# Patient Record
Sex: Male | Born: 1955 | Race: White | Hispanic: No | Smoking: Former smoker
Health system: Southern US, Community
[De-identification: ages and names within clinical notes are randomized; demographics above are authoritative.]

## PROBLEM LIST (undated history)

## (undated) DIAGNOSIS — I639 Cerebral infarction, unspecified: Secondary | ICD-10-CM

## (undated) DIAGNOSIS — S43006A Unspecified dislocation of unspecified shoulder joint, initial encounter: Secondary | ICD-10-CM

## (undated) DIAGNOSIS — I10 Essential (primary) hypertension: Secondary | ICD-10-CM

## (undated) HISTORY — DX: Cerebral infarction, unspecified: I63.9

## (undated) HISTORY — DX: Essential (primary) hypertension: I10

## (undated) HISTORY — PX: OTHER SURGICAL HISTORY: SHX169

---

## 2003-10-04 ENCOUNTER — Emergency Department (HOSPITAL_COMMUNITY): Admission: EM | Admit: 2003-10-04 | Discharge: 2003-10-04 | Payer: Self-pay

## 2003-12-13 ENCOUNTER — Emergency Department (HOSPITAL_COMMUNITY): Admission: EM | Admit: 2003-12-13 | Discharge: 2003-12-13 | Payer: Self-pay | Admitting: Emergency Medicine

## 2004-09-21 ENCOUNTER — Emergency Department (HOSPITAL_COMMUNITY): Admission: EM | Admit: 2004-09-21 | Discharge: 2004-09-21 | Payer: Self-pay | Admitting: Emergency Medicine

## 2013-01-22 ENCOUNTER — Ambulatory Visit: Payer: Self-pay | Admitting: Orthopedic Surgery

## 2013-01-22 ENCOUNTER — Encounter (HOSPITAL_COMMUNITY): Payer: Self-pay | Admitting: Emergency Medicine

## 2013-01-22 ENCOUNTER — Emergency Department (HOSPITAL_COMMUNITY): Payer: Self-pay

## 2013-01-22 ENCOUNTER — Emergency Department (HOSPITAL_COMMUNITY)
Admission: EM | Admit: 2013-01-22 | Discharge: 2013-01-22 | Disposition: A | Discharge status: Home / Self Care | Payer: Self-pay | Attending: Emergency Medicine | Admitting: Emergency Medicine

## 2013-01-22 DIAGNOSIS — S99929A Unspecified injury of unspecified foot, initial encounter: Secondary | ICD-10-CM

## 2013-01-22 DIAGNOSIS — M2180 Other specified acquired deformities of unspecified limb: Secondary | ICD-10-CM | POA: Insufficient documentation

## 2013-01-22 DIAGNOSIS — F17200 Nicotine dependence, unspecified, uncomplicated: Secondary | ICD-10-CM | POA: Insufficient documentation

## 2013-01-22 DIAGNOSIS — S99919A Unspecified injury of unspecified ankle, initial encounter: Secondary | ICD-10-CM

## 2013-01-22 DIAGNOSIS — M79672 Pain in left foot: Secondary | ICD-10-CM

## 2013-01-22 DIAGNOSIS — S8990XA Unspecified injury of unspecified lower leg, initial encounter: Secondary | ICD-10-CM | POA: Insufficient documentation

## 2013-01-22 DIAGNOSIS — Z880 Allergy status to penicillin: Secondary | ICD-10-CM | POA: Insufficient documentation

## 2013-01-22 DIAGNOSIS — S43005A Unspecified dislocation of left shoulder joint, initial encounter: Secondary | ICD-10-CM

## 2013-01-22 DIAGNOSIS — S43016A Anterior dislocation of unspecified humerus, initial encounter: Secondary | ICD-10-CM | POA: Insufficient documentation

## 2013-01-22 DIAGNOSIS — M21829 Other specified acquired deformities of unspecified upper arm: Secondary | ICD-10-CM

## 2013-01-22 LAB — BASIC METABOLIC PANEL
BUN: 14 mg/dL (ref 6–23)
CALCIUM: 9.2 mg/dL (ref 8.4–10.5)
CHLORIDE: 100 meq/L (ref 96–112)
CO2: 23 meq/L (ref 19–32)
CREATININE: 0.86 mg/dL (ref 0.50–1.35)
GFR calc Af Amer: >90 mL/min (ref >90)
GFR calc non Af Amer: >90 mL/min (ref >90)
GLUCOSE: 160 mg/dL — AB (ref 70–99)
Potassium: 4.2 mEq/L (ref 3.7–5.3)
Sodium: 136 mEq/L — ABNORMAL LOW (ref 137–147)

## 2013-01-22 LAB — CBC
HCT: 48.3 % (ref 39.0–52.0)
HEMOGLOBIN: 16.9 g/dL (ref 13.0–17.0)
MCH: 32.6 pg (ref 26.0–34.0)
MCHC: 35 g/dL (ref 30.0–36.0)
MCV: 93.2 fL (ref 78.0–100.0)
PLATELETS: 229 10*3/uL (ref 150–400)
RBC: 5.18 MIL/uL (ref 4.22–5.81)
RDW: 12.1 % (ref 11.5–15.5)
WBC: 13.6 10*3/uL — AB (ref 4.0–10.5)

## 2013-01-22 MED ORDER — PROPOFOL 10 MG/ML IV BOLUS
1.0000 mg/kg | Freq: Once | INTRAVENOUS | Status: AC
  Administered 2013-01-22: 40 mg via INTRAVENOUS
  Administered 2013-01-22: 50 mg via INTRAVENOUS
  Administered 2013-01-22: 60 mg via INTRAVENOUS
  Filled 2013-01-22: qty 1

## 2013-01-22 MED ORDER — HYDROCODONE-ACETAMINOPHEN 5-325 MG PO TABS: 2.0000 | ORAL_TABLET | ORAL | Status: DC | PRN

## 2013-01-22 MED ORDER — HYDROCODONE-ACETAMINOPHEN 5-325 MG PO TABS: 6.0000 | ORAL_TABLET | ORAL | Status: DC | PRN

## 2013-01-22 MED ORDER — HYDROMORPHONE HCL PF 1 MG/ML IJ SOLN
1.0000 mg | Freq: Once | INTRAMUSCULAR | Status: AC
  Administered 2013-01-22: 1 mg via INTRAVENOUS
  Filled 2013-01-22: qty 1
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
## 2013-01-22 NOTE — Discharge Instructions (Signed)
If you were given medicines take as directed.  If you are on coumadin or contraceptives realize their levels and effectiveness is altered by many different medicines.  If you have any reaction (rash, tongues swelling, other) to the medicines stop taking and see a physician.   Please follow up as directed and return to the ER or see a physician for new or worsening symptoms.  Thank you. See ortho in the next two days for further treatment. Wear sling until cleared.  For severe pain take norco or vicodin however realize they have the potential for addiction and it can make you sleepy and has tylenol in it.  No operating machinery while taking.

## 2013-01-22 NOTE — ED Notes (Signed)
Pt alert & oriented x4, stable gait. Patient  given discharge instructions, paperwork & prescription(s). Patient verbalized understanding. Pt left department w/ no further questions. 
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
## 2013-01-23 ENCOUNTER — Telehealth: Payer: Self-pay | Admitting: Orthopedic Surgery

## 2013-01-23 NOTE — Telephone Encounter (Signed)
Patient, and sister, Greg Fry, called to request appointment following [facility] visit, evaluation of shoulder injury, dislocation.  Patient was seen there last evening, had reduction, and was released early this morning; states was advised to see Greg Fry, in 1 day or as soon as possible.  Please review and advise.  Patient cell# is [phone].

## 2013-01-24 ENCOUNTER — Other Ambulatory Visit: Payer: Self-pay | Admitting: *Deleted

## 2013-01-24 DIAGNOSIS — S43005A Unspecified dislocation of left shoulder joint, initial encounter: Secondary | ICD-10-CM

## 2013-01-24 NOTE — Telephone Encounter (Signed)
Put in order for CT per Dr. [name]

## 2013-01-24 NOTE — Telephone Encounter (Signed)
Contacted patient regarding status, scheduling appointment - Forwarding note to Tammy for CT order per Dr's reply.

## 2013-01-24 NOTE — Telephone Encounter (Signed)
He needs a repeat ct scan shoulder before office visit   tammy can order

## 2013-01-28 NOTE — Telephone Encounter (Signed)
Patient called back and received information regarding appointments scheduled.

## 2013-01-28 NOTE — Telephone Encounter (Signed)
Called back to patient (both Tammy,LPN) and I have left a message.  I also left voice message with patient's sister, who is his contact person, ph# [phone], to call our office back (CT scan + follow up appointment scheduled)

## 2013-01-29 ENCOUNTER — Ambulatory Visit (HOSPITAL_COMMUNITY)
Admission: RE | Admit: 2013-01-29 | Discharge: 2013-01-29 | Disposition: A | Discharge status: Home / Self Care | Payer: Self-pay | Source: Ambulatory Visit | Attending: Orthopedic Surgery | Admitting: Orthopedic Surgery

## 2013-01-29 DIAGNOSIS — S43005A Unspecified dislocation of left shoulder joint, initial encounter: Secondary | ICD-10-CM

## 2013-01-29 DIAGNOSIS — R937 Abnormal findings on diagnostic imaging of other parts of musculoskeletal system: Secondary | ICD-10-CM | POA: Insufficient documentation

## 2013-01-29 DIAGNOSIS — M25519 Pain in unspecified shoulder: Secondary | ICD-10-CM | POA: Insufficient documentation

## 2013-01-29 MED FILL — Hydrocodone-Acetaminophen Tab 5-325 MG: ORAL | Qty: 6 | Status: AC

## 2013-01-31 ENCOUNTER — Ambulatory Visit (INDEPENDENT_AMBULATORY_CARE_PROVIDER_SITE_OTHER): Payer: Self-pay | Admitting: Orthopedic Surgery

## 2013-01-31 VITALS — BP 162/102 | Ht 70.0 in | Wt 204.0 lb

## 2013-01-31 DIAGNOSIS — S43006A Unspecified dislocation of unspecified shoulder joint, initial encounter: Secondary | ICD-10-CM

## 2013-01-31 DIAGNOSIS — S43005A Unspecified dislocation of left shoulder joint, initial encounter: Secondary | ICD-10-CM

## 2013-01-31 MED ORDER — HYDROCODONE-ACETAMINOPHEN 5-325 MG PO TABS: 1.0000 | ORAL_TABLET | ORAL | Status: DC | PRN
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
## 2013-01-31 NOTE — Patient Instructions (Signed)
WEAR SLING UNTIL 02/05/13

## 2013-03-07 ENCOUNTER — Ambulatory Visit: Payer: Self-pay | Admitting: Orthopedic Surgery

## 2013-04-13 ENCOUNTER — Emergency Department (HOSPITAL_COMMUNITY): Payer: Self-pay

## 2013-04-13 ENCOUNTER — Encounter (HOSPITAL_COMMUNITY): Payer: Self-pay | Admitting: Emergency Medicine

## 2013-04-13 ENCOUNTER — Emergency Department (HOSPITAL_COMMUNITY)
Admission: EM | Admit: 2013-04-13 | Discharge: 2013-04-13 | Disposition: A | Discharge status: Home / Self Care | Payer: Self-pay | Attending: Emergency Medicine | Admitting: Emergency Medicine

## 2013-04-13 DIAGNOSIS — Y929 Unspecified place or not applicable: Secondary | ICD-10-CM | POA: Insufficient documentation

## 2013-04-13 DIAGNOSIS — S43015A Anterior dislocation of left humerus, initial encounter: Secondary | ICD-10-CM

## 2013-04-13 DIAGNOSIS — IMO0002 Reserved for concepts with insufficient information to code with codable children: Secondary | ICD-10-CM | POA: Insufficient documentation

## 2013-04-13 DIAGNOSIS — S43016A Anterior dislocation of unspecified humerus, initial encounter: Secondary | ICD-10-CM | POA: Insufficient documentation

## 2013-04-13 DIAGNOSIS — Y9389 Activity, other specified: Secondary | ICD-10-CM | POA: Insufficient documentation

## 2013-04-13 DIAGNOSIS — W1809XA Striking against other object with subsequent fall, initial encounter: Secondary | ICD-10-CM | POA: Insufficient documentation

## 2013-04-13 DIAGNOSIS — Z880 Allergy status to penicillin: Secondary | ICD-10-CM | POA: Insufficient documentation

## 2013-04-13 DIAGNOSIS — F17200 Nicotine dependence, unspecified, uncomplicated: Secondary | ICD-10-CM | POA: Insufficient documentation

## 2013-04-13 HISTORY — DX: Unspecified dislocation of unspecified shoulder joint, initial encounter: S43.006A

## 2013-04-13 MED ORDER — HYDROMORPHONE HCL PF 1 MG/ML IJ SOLN
1.0000 mg | Freq: Once | INTRAMUSCULAR | Status: AC
  Administered 2013-04-13: 1 mg via INTRAVENOUS
  Filled 2013-04-13: qty 1

## 2013-04-13 MED ORDER — KETAMINE HCL 50 MG/ML IJ SOLN
1.0000 mg/kg | Freq: Once | INTRAMUSCULAR | Status: AC
  Administered 2013-04-13: 30 mg via INTRAMUSCULAR
  Filled 2013-04-13: qty 10

## 2013-04-13 MED ORDER — SODIUM CHLORIDE 0.9 % IV BOLUS (SEPSIS)
1000.0000 mL | Freq: Once | INTRAVENOUS | Status: AC
  Administered 2013-04-13: 1000 mL via INTRAVENOUS

## 2013-04-13 MED ORDER — PROPOFOL 10 MG/ML IV BOLUS
1.0000 mg/kg | Freq: Once | INTRAVENOUS | Status: AC
  Administered 2013-04-13: 30 mg via INTRAVENOUS
  Filled 2013-04-13: qty 1

## 2013-04-13 MED ORDER — OXYCODONE-ACETAMINOPHEN 5-325 MG PO TABS: 1.0000 | ORAL_TABLET | ORAL | Status: DC | PRN

## 2013-04-13 MED ORDER — ONDANSETRON HCL 4 MG/2ML IJ SOLN
4.0000 mg | Freq: Once | INTRAMUSCULAR | Status: AC
  Administered 2013-04-13: 4 mg via INTRAMUSCULAR
  Filled 2013-04-13: qty 2
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
## 2013-04-13 NOTE — Sedation Documentation (Signed)
Shoulder immobilizer placed, pt reoriented, xray order stat called, pt laughing, aware of where he is

## 2013-04-13 NOTE — Discharge Instructions (Signed)

## 2013-04-13 NOTE — Sedation Documentation (Signed)
Pt back to baseline. Family at bedside.
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
## 2013-04-13 NOTE — Sedation Documentation (Signed)
Pt responding to name, asking what medication he was given, not able to state where he is, pt reoriented.

## 2013-04-13 NOTE — Sedation Documentation (Signed)
EDP attempting to relocate left shoulder with RN help

## 2013-04-13 NOTE — Sedation Documentation (Signed)
EDP verbally ordered 50 MG of ketamine and diprovan IV during procedure. EDP only used 30 MG of each IV.

## 2013-05-05 ENCOUNTER — Emergency Department (HOSPITAL_COMMUNITY): Payer: Self-pay

## 2013-05-05 ENCOUNTER — Emergency Department (HOSPITAL_COMMUNITY)
Admission: EM | Admit: 2013-05-05 | Discharge: 2013-05-05 | Disposition: A | Discharge status: Home / Self Care | Payer: Self-pay | Attending: Emergency Medicine | Admitting: Emergency Medicine

## 2013-05-05 ENCOUNTER — Encounter (HOSPITAL_COMMUNITY): Payer: Self-pay | Admitting: Emergency Medicine

## 2013-05-05 DIAGNOSIS — S43016A Anterior dislocation of unspecified humerus, initial encounter: Secondary | ICD-10-CM | POA: Insufficient documentation

## 2013-05-05 DIAGNOSIS — F17200 Nicotine dependence, unspecified, uncomplicated: Secondary | ICD-10-CM | POA: Insufficient documentation

## 2013-05-05 DIAGNOSIS — S43005A Unspecified dislocation of left shoulder joint, initial encounter: Secondary | ICD-10-CM

## 2013-05-05 DIAGNOSIS — Y939 Activity, unspecified: Secondary | ICD-10-CM | POA: Insufficient documentation

## 2013-05-05 DIAGNOSIS — X58XXXA Exposure to other specified factors, initial encounter: Secondary | ICD-10-CM | POA: Insufficient documentation

## 2013-05-05 DIAGNOSIS — Z880 Allergy status to penicillin: Secondary | ICD-10-CM | POA: Insufficient documentation

## 2013-05-05 DIAGNOSIS — Y929 Unspecified place or not applicable: Secondary | ICD-10-CM | POA: Insufficient documentation

## 2013-05-05 MED ORDER — PROPOFOL 10 MG/ML IV BOLUS
INTRAVENOUS | Status: AC
  Administered 2013-05-05: 30 mg via INTRAVENOUS
  Filled 2013-05-05: qty 1

## 2013-05-05 MED ORDER — FENTANYL CITRATE 0.05 MG/ML IJ SOLN
100.0000 ug | Freq: Once | INTRAMUSCULAR | Status: AC
  Administered 2013-05-05: 100 ug via INTRAVENOUS
  Filled 2013-05-05: qty 2

## 2013-05-05 MED ORDER — MIDAZOLAM HCL 5 MG/5ML IJ SOLN
INTRAMUSCULAR | Status: AC | PRN
  Administered 2013-05-05: 5 mg via INTRAVENOUS

## 2013-05-05 MED ORDER — MIDAZOLAM HCL 5 MG/5ML IJ SOLN
INTRAMUSCULAR | Status: AC
  Filled 2013-05-05: qty 5

## 2013-05-05 MED ORDER — HYDROCODONE-ACETAMINOPHEN 5-325 MG PO TABS: 1.0000 | ORAL_TABLET | ORAL | Status: DC | PRN

## 2013-05-05 MED ORDER — PROPOFOL 10 MG/ML IV BOLUS
INTRAVENOUS | Status: AC | PRN
  Administered 2013-05-05: 50 mg via INTRAVENOUS
  Administered 2013-05-05: 40 mg via INTRAVENOUS
  Administered 2013-05-05: 50 mg via INTRAVENOUS
  Administered 2013-05-05: 30 mg via INTRAVENOUS

## 2013-05-05 NOTE — ED Notes (Signed)
Pt has history of shoulder dislocation. Pt raised arm above head & shoulder came out again.

## 2013-05-05 NOTE — ED Notes (Signed)
Pt alert & oriented x4, stable gait. Patient given discharge instructions, paperwork & prescription(s). Patient  instructed to stop at the registration desk to finish any additional paperwork. Patient verbalized understanding. Pt left department w/ no further questions. 

## 2013-05-05 NOTE — Discharge Instructions (Signed)

## 2013-05-05 NOTE — ED Provider Notes (Signed)
CSN: [id number]     Arrival date & time 05/05/13  0112 History   First MD Initiated Contact with Patient 05/05/13 0119     Chief Complaint  Patient presents with  . Shoulder Injury     (Consider location/radiation/quality/duration/timing/severity/associated sxs/prior Treatment) HPI Patient with recurrent left shoulder dislocation presents with acute onset of a left shoulder dislocation when raising the left arm this evening. He has deformity and pain at the left shoulder. He has no numbness or tingling. Patient denies any other injury. Past Medical History  Diagnosis Date  . Dislocated shoulder    History reviewed. No pertinent past surgical history. No family history on file. History  Substance Use Topics  . Smoking status: Current Every Day Smoker  . Smokeless tobacco: Not on file  . Alcohol Use: Yes    Review of Systems  Constitutional: Negative for fever and chills.  Musculoskeletal: Positive for arthralgias. Negative for neck pain and neck stiffness.  Skin: Negative for rash and wound.  Neurological: Negative for weakness and numbness.  All other systems reviewed and are negative.     Allergies  Penicillins  Home Medications   Prior to Admission medications   Medication Sig Start Date End Date Taking? Authorizing Provider  HYDROcodone-acetaminophen (NORCO) 5-325 MG per tablet Take 1 tablet by mouth every 4 (four) hours as needed. 01/31/13   Vickki HearingStanley E Harrison, MD  oxyCODONE-acetaminophen (PERCOCET/ROXICET) 5-325 MG per tablet Take 1-2 tablets by mouth every 4 (four) hours as needed for severe pain. 04/13/13   Raeford RazorStephen Kohut, MD   BP 98/64  Pulse 63  Temp(Src) 97.6 F (36.4 C) (Oral)  Resp 24  Ht 5\' 10"  (1.778 m)  Wt 200 lb (90.719 kg)  BMI 28.70 kg/m2  SpO2 99% Physical Exam  Nursing note and vitals reviewed. Constitutional: He is oriented to person, place, and time. He appears well-developed and well-nourished. No distress.  HENT:  Head: Normocephalic and  atraumatic.  Mouth/Throat: Oropharynx is clear and moist.  Eyes: EOM are normal. Pupils are equal, round, and reactive to light.  Neck: Normal range of motion. Neck supple.  Cardiovascular: Normal rate and regular rhythm.   Pulmonary/Chest: Effort normal and breath sounds normal. No respiratory distress. He has no wheezes. He has no rales.  Abdominal: Soft. Bowel sounds are normal.  Musculoskeletal: He exhibits no edema and no tenderness.  Step off at the left shoulder. Decreased range of motion of the left shoulder. 2+ radial pulses bilaterally.  Neurological: He is alert and oriented to person, place, and time.  Good grip strength bilaterally. Sensation fully intact.  Skin: Skin is warm and dry. No rash noted. No erythema.  Psychiatric: He has a normal mood and affect. His behavior is normal.    ED Course  Reduction of dislocation Date/Time: 05/05/2013 6:10 AM Performed by: Loren RacerYELVERTON, Alanson Authorized by: Ranae PalmsYELVERTON, Ramaj Consent: written consent obtained. Imaging studies: imaging studies available Patient identity confirmed: verbally with patient and arm band Time out: Immediately prior to procedure a "time out" was called to verify the correct patient, procedure, equipment, support staff and site/side marked as required. Local anesthesia used: no Patient sedated: yes Sedation type: moderate (conscious) sedation Sedatives: midazolam and propofol Analgesia: fentanyl Vitals: Vital signs were monitored during sedation. Patient tolerance: Patient tolerated the procedure well with no immediate complications.   (including critical care time) Labs Review Labs Reviewed - No data to display  Imaging Review Dg Shoulder Left  05/05/2013   CLINICAL DATA:  Shoulder injury.  Previous  dislocations.  EXAM: LEFT SHOULDER - 2+ VIEW  COMPARISON:  04/13/2013  FINDINGS: Anterior dislocation of the left shoulder with impaction of the posterior aspect of the humeral head on the glenoid. Old  Hill-Sachs fracture deformities with displaced bone fragments as previously identified. Coracoclavicular and acromioclavicular spaces are maintained.  IMPRESSION: Anterior dislocation of the left shoulder.   Electronically Signed   By: Burman NievesWilliam  Stevens M.D.   On: 05/05/2013 02:12     EKG Interpretation None      MDM   Final diagnoses:  None    Patient with recurrent left shoulder dislocation. We'll attempt to reduce the emergency department and stress to the patient the necessity of following up with an orthopedist.  Dislocation successfully reduced. Patient remains neurologically stable with good distal pulses. He is placed in a shoulder immobilizer. Is encouraged to followup with orthopedics. Return precautions given.  Loren Raceravid Deandrew Hoecker, MD 05/05/13 [phone]

## 2013-06-28 ENCOUNTER — Encounter (HOSPITAL_COMMUNITY)
Admission: EM | Disposition: A | Discharge status: Home / Self Care | Payer: Self-pay | Source: Home / Self Care | Attending: Emergency Medicine

## 2013-06-28 ENCOUNTER — Ambulatory Visit (HOSPITAL_COMMUNITY)
Admission: EM | Admit: 2013-06-28 | Discharge: 2013-06-28 | Disposition: A | Discharge status: Home / Self Care | Payer: Self-pay | Attending: Orthopedic Surgery | Admitting: Orthopedic Surgery

## 2013-06-28 ENCOUNTER — Encounter (HOSPITAL_COMMUNITY): Payer: Self-pay | Admitting: Emergency Medicine

## 2013-06-28 ENCOUNTER — Emergency Department (HOSPITAL_COMMUNITY): Payer: Self-pay

## 2013-06-28 ENCOUNTER — Ambulatory Visit (HOSPITAL_COMMUNITY): Payer: Self-pay

## 2013-06-28 ENCOUNTER — Emergency Department (HOSPITAL_COMMUNITY): Payer: Self-pay | Admitting: Anesthesiology

## 2013-06-28 ENCOUNTER — Encounter (HOSPITAL_COMMUNITY): Payer: Self-pay | Admitting: Anesthesiology

## 2013-06-28 DIAGNOSIS — Y929 Unspecified place or not applicable: Secondary | ICD-10-CM | POA: Insufficient documentation

## 2013-06-28 DIAGNOSIS — F1410 Cocaine abuse, uncomplicated: Secondary | ICD-10-CM | POA: Insufficient documentation

## 2013-06-28 DIAGNOSIS — F17200 Nicotine dependence, unspecified, uncomplicated: Secondary | ICD-10-CM | POA: Insufficient documentation

## 2013-06-28 DIAGNOSIS — S43016A Anterior dislocation of unspecified humerus, initial encounter: Secondary | ICD-10-CM | POA: Insufficient documentation

## 2013-06-28 DIAGNOSIS — F1210 Cannabis abuse, uncomplicated: Secondary | ICD-10-CM | POA: Insufficient documentation

## 2013-06-28 DIAGNOSIS — W19XXXA Unspecified fall, initial encounter: Secondary | ICD-10-CM | POA: Insufficient documentation

## 2013-06-28 DIAGNOSIS — M24419 Recurrent dislocation, unspecified shoulder: Secondary | ICD-10-CM

## 2013-06-28 DIAGNOSIS — Z888 Allergy status to other drugs, medicaments and biological substances status: Secondary | ICD-10-CM | POA: Insufficient documentation

## 2013-06-28 DIAGNOSIS — Z880 Allergy status to penicillin: Secondary | ICD-10-CM | POA: Insufficient documentation

## 2013-06-28 HISTORY — PX: SHOULDER CLOSED REDUCTION: SHX1051

## 2013-06-28 LAB — BASIC METABOLIC PANEL
BUN: 11 mg/dL (ref 6–23)
CALCIUM: 8.3 mg/dL — AB (ref 8.4–10.5)
CO2: 22 mEq/L (ref 19–32)
Chloride: 106 mEq/L (ref 96–112)
Creatinine, Ser: 0.79 mg/dL (ref 0.50–1.35)
GFR calc Af Amer: >90 mL/min (ref >90)
GLUCOSE: 119 mg/dL — AB (ref 70–99)
Potassium: 4.3 mEq/L (ref 3.7–5.3)
Sodium: 140 mEq/L (ref 137–147)

## 2013-06-28 LAB — CBC WITH DIFFERENTIAL/PLATELET
Basophils Absolute: 0 10*3/uL (ref 0.0–0.1)
Basophils Relative: 0 % (ref 0–1)
EOS ABS: 0 10*3/uL (ref 0.0–0.7)
Eosinophils Relative: 0 % (ref 0–5)
HEMATOCRIT: 43.2 % (ref 39.0–52.0)
Hemoglobin: 15.4 g/dL (ref 13.0–17.0)
Lymphocytes Relative: 6 % — ABNORMAL LOW (ref 12–46)
Lymphs Abs: 0.7 10*3/uL (ref 0.7–4.0)
MCH: 32.9 pg (ref 26.0–34.0)
MCHC: 35.6 g/dL (ref 30.0–36.0)
MCV: 92.3 fL (ref 78.0–100.0)
MONO ABS: 0.5 10*3/uL (ref 0.1–1.0)
MONOS PCT: 5 % (ref 3–12)
Neutro Abs: 10.2 10*3/uL — ABNORMAL HIGH (ref 1.7–7.7)
Neutrophils Relative %: 89 % — ABNORMAL HIGH (ref 43–77)
Platelets: 201 10*3/uL (ref 150–400)
RBC: 4.68 MIL/uL (ref 4.22–5.81)
RDW: 12.5 % (ref 11.5–15.5)
WBC: 11.5 10*3/uL — ABNORMAL HIGH (ref 4.0–10.5)

## 2013-06-28 LAB — RAPID URINE DRUG SCREEN, HOSP PERFORMED
Amphetamines: NOT DETECTED
BARBITURATES: NOT DETECTED
Benzodiazepines: NOT DETECTED
Cocaine: POSITIVE — AB
Opiates: NOT DETECTED
TETRAHYDROCANNABINOL: NOT DETECTED

## 2013-06-28 SURGERY — CLOSED REDUCTION, SHOULDER
Anesthesia: General | Laterality: Left

## 2013-06-28 MED ORDER — HYDROCODONE-ACETAMINOPHEN 7.5-325 MG PO TABS: 1.0000 | ORAL_TABLET | ORAL | Status: DC | PRN

## 2013-06-28 MED ORDER — MIDAZOLAM HCL 2 MG/2ML IJ SOLN
1.0000 mg | INTRAMUSCULAR | Status: DC | PRN
  Administered 2013-06-28: 2 mg via INTRAVENOUS

## 2013-06-28 MED ORDER — LIDOCAINE HCL (PF) 1 % IJ SOLN
INTRAMUSCULAR | Status: AC
  Filled 2013-06-28: qty 5

## 2013-06-28 MED ORDER — ONDANSETRON HCL 4 MG/2ML IJ SOLN
4.0000 mg | Freq: Once | INTRAMUSCULAR | Status: AC
  Administered 2013-06-28: 4 mg via INTRAVENOUS

## 2013-06-28 MED ORDER — MIDAZOLAM HCL 2 MG/2ML IJ SOLN
INTRAMUSCULAR | Status: AC
  Filled 2013-06-28: qty 2

## 2013-06-28 MED ORDER — PROPOFOL 10 MG/ML IV BOLUS
INTRAVENOUS | Status: AC
  Filled 2013-06-28: qty 20

## 2013-06-28 MED ORDER — PROPOFOL 10 MG/ML IV BOLUS
INTRAVENOUS | Status: DC | PRN
  Administered 2013-06-28: 50 mg via INTRAVENOUS

## 2013-06-28 MED ORDER — GLYCOPYRROLATE 0.2 MG/ML IJ SOLN
0.2000 mg | Freq: Once | INTRAMUSCULAR | Status: AC
  Administered 2013-06-28: 0.2 mg via INTRAVENOUS

## 2013-06-28 MED ORDER — FENTANYL CITRATE 0.05 MG/ML IJ SOLN: 25.0000 ug | INTRAMUSCULAR | Status: DC | PRN

## 2013-06-28 MED ORDER — LIDOCAINE HCL (CARDIAC) 20 MG/ML IV SOLN
INTRAVENOUS | Status: DC | PRN
  Administered 2013-06-28: 100 mg via INTRAVENOUS

## 2013-06-28 MED ORDER — LACTATED RINGERS IV SOLN
INTRAVENOUS | Status: DC | PRN
  Administered 2013-06-28 (×2): via INTRAVENOUS

## 2013-06-28 MED ORDER — ONDANSETRON HCL 4 MG/2ML IJ SOLN: 4.0000 mg | Freq: Once | INTRAMUSCULAR | Status: DC | PRN

## 2013-06-28 MED ORDER — PROPOFOL 10 MG/ML IV BOLUS
INTRAVENOUS | Status: AC | PRN
  Administered 2013-06-28: 25 mg via INTRAVENOUS

## 2013-06-28 MED ORDER — FENTANYL CITRATE 0.05 MG/ML IJ SOLN
INTRAMUSCULAR | Status: AC
  Filled 2013-06-28: qty 2

## 2013-06-28 MED ORDER — PROPOFOL 10 MG/ML IV BOLUS
200.0000 mg | Freq: Once | INTRAVENOUS | Status: AC
  Administered 2013-06-28: 50 mg via INTRAVENOUS
  Filled 2013-06-28: qty 1

## 2013-06-28 MED ORDER — PROPOFOL 10 MG/ML IV BOLUS
INTRAVENOUS | Status: DC | PRN
  Administered 2013-06-28: 200 mg via INTRAVENOUS
  Administered 2013-06-28: 100 mg via INTRAVENOUS

## 2013-06-28 MED ORDER — ONDANSETRON HCL 4 MG/2ML IJ SOLN
INTRAMUSCULAR | Status: AC
  Filled 2013-06-28: qty 2

## 2013-06-28 MED ORDER — KETAMINE HCL 50 MG/ML IJ SOLN
0.5000 mg/kg | Freq: Once | INTRAMUSCULAR | Status: AC
  Administered 2013-06-28: 25 mg via INTRAMUSCULAR
  Filled 2013-06-28: qty 10

## 2013-06-28 MED ORDER — FENTANYL CITRATE 0.05 MG/ML IJ SOLN
INTRAMUSCULAR | Status: DC | PRN
  Administered 2013-06-28 (×2): 50 ug via INTRAVENOUS

## 2013-06-28 MED ORDER — HYDROMORPHONE HCL PF 1 MG/ML IJ SOLN
0.5000 mg | Freq: Once | INTRAMUSCULAR | Status: AC
  Administered 2013-06-28: 0.5 mg via INTRAVENOUS
  Filled 2013-06-28: qty 1

## 2013-06-28 MED ORDER — GLYCOPYRROLATE 0.2 MG/ML IJ SOLN
INTRAMUSCULAR | Status: AC
  Filled 2013-06-28: qty 1

## 2013-06-28 MED ORDER — LACTATED RINGERS IV SOLN
INTRAVENOUS | Status: DC
  Administered 2013-06-28: 13:00:00 via INTRAVENOUS

## 2013-06-28 MED ORDER — FENTANYL CITRATE 0.05 MG/ML IJ SOLN
25.0000 ug | INTRAMUSCULAR | Status: AC
  Administered 2013-06-28 (×2): 25 ug via INTRAVENOUS

## 2013-06-28 SURGICAL SUPPLY — 3 items
CLOTH BEACON ORANGE TIMEOUT ST (SAFETY) ×3 IMPLANT
IMMOBILIZER SHOULDER LGE (ORTHOPEDIC SUPPLIES) ×2 IMPLANT
KIT ROOM TURNOVER APOR (KITS) ×3 IMPLANT

## 2013-06-28 NOTE — ED Notes (Signed)
Fall today - landing on left shoulder.  Reports dislocation.  Pt also has bruising to right eye.

## 2013-06-28 NOTE — Anesthesia Postprocedure Evaluation (Signed)
  Anesthesia Post-op Note  Patient: Greg Fry  Procedure(s) Performed: Procedure(s): CLOSED REDUCTION LEFT SHOULDER UNDER ANESTHESIA (Left)  Patient Location: PACU  Anesthesia Type:General  Level of Consciousness: awake, alert  and oriented  Airway and Oxygen Therapy: Patient Spontanous Breathing and Patient connected to face mask oxygen  Post-op Pain: none  Post-op Assessment: Post-op Vital signs reviewed, Patient's Cardiovascular Status Stable and Respiratory Function Stable  Post-op Vital Signs: Reviewed and stable  Last Vitals:  Filed Vitals:   06/28/13 1300  BP: 188/89  Pulse:   Temp:   Resp: 35    Complications: No apparent anesthesia complications

## 2013-06-28 NOTE — Brief Op Note (Signed)
06/28/2013  1:34 PM  PATIENT:  Greg Fry  58 y.o. male  PRE-OPERATIVE DIAGNOSIS:  Unreducible Left Shoulder Dislocation  POST-OPERATIVE DIAGNOSIS:  Unreducible Left Shoulder Dislocation  Operative findings: There is a large Hill-Sachs defect is noted. The patient dislocated in abduction external rotation at 90 abduction and 25 of external rotation.  Details of procedure site marking was performed in the preop area. He was taken to surgery and general anesthesia. After timeout the arm was taken into full forward elevation and external rotation and clunk back in place. We took x-rays after exam under anesthesia revealed his dislocation position of 90 abduction and 25 external rotation. We were able to get a good axillary view and a normal AP view which showed reduction. Was placed in a sling-and-swathe which he is to wear for 6 weeks.  PROCEDURE:  Procedure(s): CLOSED REDUCTION LEFT SHOULDER UNDER ANESTHESIA (Left)  SURGEON:  Surgeon(s) and Role:    * Remie Mathison E Darnice Comrie, MD - Primary  PHYSICIAN ASSISTANT:   ASSISTANTS: none   ANESTHESIA:   general  EBL:  Total I/O In: 1000 [I.V.:1000] Out: -   BLOOD ADMINISTERED:none  DRAINS: none   LOCAL MEDICATIONS USED:  NONE  SPECIMEN:  No Specimen  DISPOSITION OF SPECIMEN:  N/A  COUNTS:  NO count , no incision  TOURNIQUET:  * No tourniquets in log *  DICTATION: .Dragon Dictation  PLAN OF CARE: Discharge to home after PACU  PATIENT DISPOSITION:  PACU - hemodynamically stable.   Delay start of Pharmacological VTE agent (>24hrs) due to surgical blood loss or risk of bleeding: not applicable  

## 2013-06-28 NOTE — ED Notes (Signed)
2nd attempt sedation and reduction unsuccessful. Pt placed prone on stretched with 10lb weight hanging from left arm.

## 2013-06-28 NOTE — Op Note (Signed)
06/28/2013  1:34 PM  PATIENT:  Greg Fry  58 y.o. male  PRE-OPERATIVE DIAGNOSIS:  Unreducible Left Shoulder Dislocation  POST-OPERATIVE DIAGNOSIS:  Unreducible Left Shoulder Dislocation  Operative findings: There is a large Hill-Sachs defect is noted. The patient dislocated in abduction external rotation at 90 abduction and 25 of external rotation.  Details of procedure site marking was performed in the preop area. He was taken to surgery and general anesthesia. After timeout the arm was taken into full forward elevation and external rotation and clunk back in place. We took x-rays after exam under anesthesia revealed his dislocation position of 90 abduction and 25 external rotation. We were able to get a good axillary view and a normal AP view which showed reduction. Was placed in a sling-and-swathe which he is to wear for 6 weeks.  PROCEDURE:  Procedure(s): CLOSED REDUCTION LEFT SHOULDER UNDER ANESTHESIA (Left)  SURGEON:  Surgeon(s) and Role:    * Vickki HearingStanley E Anniston Nellums, MD - Primary  PHYSICIAN ASSISTANT:   ASSISTANTS: none   ANESTHESIA:   general  EBL:  Total I/O In: 1000 [I.V.:1000] Out: -   BLOOD ADMINISTERED:none  DRAINS: none   LOCAL MEDICATIONS USED:  NONE  SPECIMEN:  No Specimen  DISPOSITION OF SPECIMEN:  N/A  COUNTS:  NO count , no incision  TOURNIQUET:  * No tourniquets in log *  DICTATION: .Dragon Dictation  PLAN OF CARE: Discharge to home after PACU  PATIENT DISPOSITION:  PACU - hemodynamically stable.   Delay start of Pharmacological VTE agent (>24hrs) due to surgical blood loss or risk of bleeding: not applicable

## 2013-06-28 NOTE — Op Note (Signed)
06/28/2013  1:34 PM  PATIENT:  Greg Fry  58 y.o. male  PRE-OPERATIVE DIAGNOSIS:  Unreducible Left Shoulder Dislocation  POST-OPERATIVE DIAGNOSIS:  Unreducible Left Shoulder Dislocation  Operative findings: There is a large Hill-Sachs defect is noted. The patient dislocated in abduction external rotation at 90 abduction and 25 of external rotation.  Details of procedure site marking was performed in the preop area. He was taken to surgery and general anesthesia. After timeout the arm was taken into full forward elevation and external rotation and clunk back in place. We took x-rays after exam under anesthesia revealed his dislocation position of 90 abduction and 25 external rotation. We were able to get a good axillary view and a normal AP view which showed reduction. Was placed in a sling-and-swathe which he is to wear for 6 weeks.  PROCEDURE:  Procedure(s): CLOSED REDUCTION LEFT SHOULDER UNDER ANESTHESIA (Left)  SURGEON:  Surgeon(s) and Role:    * Vickki HearingStanley E Larra Crunkleton, MD - Primary  PHYSICIAN ASSISTANT:   ASSISTANTS: none   ANESTHESIA:   general  EBL:  Total I/O In: 1000 [I.V.:1000] Out: -   BLOOD ADMINISTERED:none  DRAINS: none   LOCAL MEDICATIONS USED:  NONE  SPECIMEN:  No Specimen  DISPOSITION OF SPECIMEN:  N/A  COUNTS:  NO count , no incision  TOURNIQUET:  * No tourniquets in log *  DICTATION: .Dragon Dictation  PLAN OF CARE: Discharge to home after PACU  PATIENT DISPOSITION:  PACU - hemodynamically stable.   Delay start of Pharmacological VTE agent (>24hrs) due to surgical blood loss or risk of bleeding: not applicable  [id number]

## 2013-06-28 NOTE — Progress Notes (Signed)
Note the patient's tox screen came back positive however this is deemed by me the surgeon to be a limb threatening dislocation of the left shoulder secondary to a reduced ability. Proceed with surgery under cautions as necessary

## 2013-06-28 NOTE — ED Notes (Signed)
Pt in bed. VSS. Family at bedside. Left shoulder injury, pt guarding/protecting left shoulder/arm. Left nail bed capillary refill brisk, skin color WNL, +3 radial pulse.

## 2013-06-28 NOTE — H&P (View-Only) (Signed)
Reason for Consult:unreducible left shoulder Referring Physician: Emergency room physician  Greg Fry is an 58 y.o. male.  HPI: 58 yo male with multiple dislocations, fell last night, injured shoulder came this am c/o pain and deformity. 2 unsuccessful attempts were made in the ER and I was called to evaluate the patient. He has mild pain with obvious drformity/  Past Medical History  Diagnosis Date  . Dislocated shoulder     History reviewed. No pertinent past surgical history.  No family history on file.  Social History:  reports that he has been smoking Cigarettes.  He has been smoking about 0.00 packs per day. He does not have any smokeless tobacco history on file. He reports that he drinks alcohol. He reports that he uses illicit drugs (Cocaine and Marijuana).  Allergies:  Allergies  Allergen Reactions  . Penicillins Other (See Comments)    Child hood reaction  . Versed [Midazolam]     hallucinations    Medications: I have reviewed the patient's current medications.  Results for orders placed during the hospital encounter of 06/28/13 (from the past 48 hour(s))  CBC WITH DIFFERENTIAL     Status: Abnormal   Collection Time    06/28/13 10:06 AM      Result Value Ref Range   WBC 11.5 (*) 4.0 - 10.5 K/uL   RBC 4.68  4.22 - 5.81 MIL/uL   Hemoglobin 15.4  13.0 - 17.0 g/dL   HCT 43.2  39.0 - 52.0 %   MCV 92.3  78.0 - 100.0 fL   MCH 32.9  26.0 - 34.0 pg   MCHC 35.6  30.0 - 36.0 g/dL   RDW 12.5  11.5 - 15.5 %   Platelets 201  150 - 400 K/uL   Neutrophils Relative % 89 (*) 43 - 77 %   Neutro Abs 10.2 (*) 1.7 - 7.7 K/uL   Lymphocytes Relative 6 (*) 12 - 46 %   Lymphs Abs 0.7  0.7 - 4.0 K/uL   Monocytes Relative 5  3 - 12 %   Monocytes Absolute 0.5  0.1 - 1.0 K/uL   Eosinophils Relative 0  0 - 5 %   Eosinophils Absolute 0.0  0.0 - 0.7 K/uL   Basophils Relative 0  0 - 1 %   Basophils Absolute 0.0  0.0 - 0.1 K/uL  BASIC METABOLIC PANEL     Status: Abnormal   Collection  Time    06/28/13 10:06 AM      Result Value Ref Range   Sodium 140  137 - 147 mEq/L   Potassium 4.3  3.7 - 5.3 mEq/L   Chloride 106  96 - 112 mEq/L   CO2 22  19 - 32 mEq/L   Glucose, Bld 119 (*) 70 - 99 mg/dL   BUN 11  6 - 23 mg/dL   Creatinine, Ser 0.79  0.50 - 1.35 mg/dL   Calcium 8.3 (*) 8.4 - 10.5 mg/dL   GFR calc non Af Amer >90  >90 mL/min   GFR calc Af Amer >90  >90 mL/min   Comment: (NOTE)     The eGFR has been calculated using the CKD EPI equation.     This calculation has not been validated in all clinical situations.     eGFR's persistently <90 mL/min signify possible Chronic Kidney     Disease.    Dg Shoulder Left  06/28/2013   CLINICAL DATA:  Fall.  History of dislocations.  EXAM: LEFT SHOULDER - 2+  VIEW  COMPARISON:  05/05/2013 and previous  FINDINGS: There is recurrent anterior inferior dislocation of the humeral head relative to the glenoid. Large Hill-Sachs defect of the humeral head remains evident. Chronic bony fragments evident in the soft tissues laterally. Multiple vascular calcifications in the left supraclavicular region as seen previously.  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   Electronically Signed   By: Nelson Chimes M.D.   On: 06/28/2013 07:54    ROS negative Blood pressure 125/95, pulse 67, temperature 97.5 F (36.4 C), temperature source Oral, resp. rate 12, height _0  (1.778 m), weight 198 lb (89.812 kg), SpO2 97.00%. Physical Exam  General the patient is well-developed and well-nourished grooming and hygiene are normal Oriented x3 Mood and affect normal Ambulation normal Inspection of the left shoulder shows shoulder deformity decreased range of motion obvious unstable joint intact motor function of the forearm and wrist Skin clean dry and intact  Cardiovascular exam is normal Sensory exam normal  Assessment/Plan: Left shoulder unreducible dislocation, multiple dislocations.  Arther Abbott 06/28/2013, 11:26 AM

## 2013-06-28 NOTE — ED Notes (Signed)
Patient transferred to OR, care handed off to [place], [place]

## 2013-06-28 NOTE — Consult Note (Signed)
Reason for Consult:unreducible left shoulder Referring Physician: Emergency room physician  Greg Fry is an 58 y.o. male.  HPI: 58 yo male with multiple dislocations, fell last night, injured shoulder came this am c/o pain and deformity. 2 unsuccessful attempts were made in the ER and I was called to evaluate the patient. He has mild pain with obvious drformity/  Past Medical History  Diagnosis Date  . Dislocated shoulder     History reviewed. No pertinent past surgical history.  No family history on file.  Social History:  reports that he has been smoking Cigarettes.  He has been smoking about 0.00 packs per day. He does not have any smokeless tobacco history on file. He reports that he drinks alcohol. He reports that he uses illicit drugs (Cocaine and Marijuana).  Allergies:  Allergies  Allergen Reactions  . Penicillins Other (See Comments)    Child hood reaction  . Versed [Midazolam]     hallucinations    Medications: I have reviewed the patient's current medications.  Results for orders placed during the hospital encounter of 06/28/13 (from the past 48 hour(s))  CBC WITH DIFFERENTIAL     Status: Abnormal   Collection Time    06/28/13 10:06 AM      Result Value Ref Range   WBC 11.5 (*) 4.0 - 10.5 K/uL   RBC 4.68  4.22 - 5.81 MIL/uL   Hemoglobin 15.4  13.0 - 17.0 g/dL   HCT 43.2  39.0 - 52.0 %   MCV 92.3  78.0 - 100.0 fL   MCH 32.9  26.0 - 34.0 pg   MCHC 35.6  30.0 - 36.0 g/dL   RDW 12.5  11.5 - 15.5 %   Platelets 201  150 - 400 K/uL   Neutrophils Relative % 89 (*) 43 - 77 %   Neutro Abs 10.2 (*) 1.7 - 7.7 K/uL   Lymphocytes Relative 6 (*) 12 - 46 %   Lymphs Abs 0.7  0.7 - 4.0 K/uL   Monocytes Relative 5  3 - 12 %   Monocytes Absolute 0.5  0.1 - 1.0 K/uL   Eosinophils Relative 0  0 - 5 %   Eosinophils Absolute 0.0  0.0 - 0.7 K/uL   Basophils Relative 0  0 - 1 %   Basophils Absolute 0.0  0.0 - 0.1 K/uL  BASIC METABOLIC PANEL     Status: Abnormal   Collection  Time    06/28/13 10:06 AM      Result Value Ref Range   Sodium 140  137 - 147 mEq/L   Potassium 4.3  3.7 - 5.3 mEq/L   Chloride 106  96 - 112 mEq/L   CO2 22  19 - 32 mEq/L   Glucose, Bld 119 (*) 70 - 99 mg/dL   BUN 11  6 - 23 mg/dL   Creatinine, Ser 0.79  0.50 - 1.35 mg/dL   Calcium 8.3 (*) 8.4 - 10.5 mg/dL   GFR calc non Af Amer >90  >90 mL/min   GFR calc Af Amer >90  >90 mL/min   Comment: (NOTE)     The eGFR has been calculated using the CKD EPI equation.     This calculation has not been validated in all clinical situations.     eGFR's persistently <90 mL/min signify possible Chronic Kidney     Disease.    Dg Shoulder Left  06/28/2013   CLINICAL DATA:  Fall.  History of dislocations.  EXAM: LEFT SHOULDER - 2+   VIEW  COMPARISON:  05/05/2013 and previous  FINDINGS: There is recurrent anterior inferior dislocation of the humeral head relative to the glenoid. Large Hill-Sachs defect of the humeral head remains evident. Chronic bony fragments evident in the soft tissues laterally. Multiple vascular calcifications in the left supraclavicular region as seen previously.  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   Electronically Signed   By: Nelson Chimes M.D.   On: 06/28/2013 07:54    ROS negative Blood pressure 125/95, pulse 67, temperature 97.5 F (36.4 C), temperature source Oral, resp. rate 12, height _0  (1.778 m), weight 198 lb (89.812 kg), SpO2 97.00%. Physical Exam  General the patient is well-developed and well-nourished grooming and hygiene are normal Oriented x3 Mood and affect normal Ambulation normal Inspection of the left shoulder shows shoulder deformity decreased range of motion obvious unstable joint intact motor function of the forearm and wrist Skin clean dry and intact  Cardiovascular exam is normal Sensory exam normal  Assessment/Plan: Left shoulder unreducible dislocation, multiple dislocations.  Arther Abbott 06/28/2013, 11:26 AM

## 2013-06-28 NOTE — Interval H&P Note (Signed)
History and Physical Interval Note:  06/28/2013 12:54 PM  Greg Fry  has presented today for surgery, with the diagnosis of DISLOCATED SHOULDER LEFT  The various methods of treatment have been discussed with the patient and family. After consideration of risks, benefits and other options for treatment, the patient has consented to  Procedure(s): CLOSED MANIPULATION SHOULDER (Left) as a surgical intervention .  The patient's history has been reviewed, patient examined, no change in status, stable for surgery.  I have reviewed the patient's chart and labs.  Questions were answered to the patient's satisfaction.     Fuller CanadaStanley Kylil Swopes

## 2013-06-28 NOTE — Anesthesia Procedure Notes (Signed)
Procedure Name: LMA Insertion Date/Time: 06/28/2013 1:12 PM Performed by: Caren MacadamARTER, Aleece Loyd W Pre-anesthesia Checklist: Patient identified, Emergency Drugs available, Suction available and Patient being monitored Patient Re-evaluated:Patient Re-evaluated prior to inductionOxygen Delivery Method: Circle System Utilized Preoxygenation: Pre-oxygenation with 100% oxygen Intubation Type: IV induction Ventilation: Mask ventilation without difficulty LMA: LMA inserted LMA Size: 5.0 Number of attempts: 1 Airway Equipment and Method: bite block Placement Confirmation: positive ETCO2 and breath sounds checked- equal and bilateral Tube secured with: Tape Dental Injury: Teeth and Oropharynx as per pre-operative assessment

## 2013-06-28 NOTE — Transfer of Care (Signed)
Immediate Anesthesia Transfer of Care Note  Patient: Greg Fry  Procedure(s) Performed: Procedure(s): CLOSED REDUCTION LEFT SHOULDER UNDER ANESTHESIA (Left)  Patient Location: PACU  Anesthesia Type:General  Level of Consciousness: awake and alert   Airway & Oxygen Therapy: Patient Spontanous Breathing and Patient connected to face mask oxygen  Post-op Assessment: Report given to PACU RN and Post -op Vital signs reviewed and stable  Post vital signs: Reviewed and stable  Complications: No apparent anesthesia complications

## 2013-06-28 NOTE — ED Notes (Addendum)
Dr. Wilkie AyeHorton attempted left shoulder reduction at bedside, with myself and two other ED RN's in room to assist. Start [phone] finished 0850. VSS throughout. Unsuccessful reduction, explained this to pt and sister who is back at bedside at this time. Pt alert, follows commands.

## 2013-06-28 NOTE — Anesthesia Preprocedure Evaluation (Addendum)
Anesthesia Evaluation  Patient identified by MRN, date of birth, ID band Patient awake    Reviewed: Allergy & Precautions, H&P , NPO status , Patient's Chart, lab work & pertinent test results  Airway Mallampati: II TM Distance: >3 FB Neck ROM: Full    Dental  (+) Teeth Intact, Poor Dentition, Dental Advisory Given   Pulmonary Current Smoker,  breath sounds clear to auscultation        Cardiovascular negative cardio ROS  Rhythm:Regular Rate:Normal     Neuro/Psych    GI/Hepatic negative GI ROS, (+)     substance abuse  cocaine use and marijuana use,   Endo/Other    Renal/GU      Musculoskeletal   Abdominal   Peds  Hematology   Anesthesia Other Findings Last cocaine 1 wk ago   Reproductive/Obstetrics                          Anesthesia Physical Anesthesia Plan  ASA: II and emergent  Anesthesia Plan: General   Post-op Pain Management:    Induction: Intravenous  Airway Management Planned: LMA  Additional Equipment:   Intra-op Plan:   Post-operative Plan: Extubation in OR  Informed Consent: I have reviewed the patients History and Physical, chart, labs and discussed the procedure including the risks, benefits and alternatives for the proposed anesthesia with the patient or authorized representative who has indicated his/her understanding and acceptance.     Plan Discussed with:   Anesthesia Plan Comments: (Pt not allergic to versed, hallucinations were due to the ketamine he received.)       Anesthesia Quick Evaluation

## 2013-06-28 NOTE — ED Provider Notes (Signed)
CSN: [id number]     Arrival date & time 06/28/13  [id number] History  This chart was scribed for Greg Batonourtney F Rhet Rorke, MD by Shari HeritageAisha Amuda, ED Scribe. The patient was seen in room [facility]06[facility]. Patient's care was started at 7:29 AM.  Chief Complaint  Patient presents with  . Shoulder Pain     The history is provided by the patient. No language interpreter was used.    HPI Comments: Greg Fry is a 58 y.o. male who presents to the Emergency Department complaining of a fall that occurred this morning about 6.5 hours ago (1 AM). Patient states that he tripped over his new puppy while trying to go down his steps. He reports that he hit his head and landed on his left shoulder. Patient has periorbital bruising on the right. He denies loss of consciousness. Patient is also complaining of constant left shoulder pain that he rates 10/10 in severity. Patient thinks that it may be dislocated. He states that he has dislocated his shoulder 4 times in the last 4 months. He has seen an orthopedist, Dr. [name], about this problem who advised using a sling and resting his shoulder. He denies chest pain, shortness of breath, or abdominal pain. He has no chronic medical conditions. Patient does not take any medications on a regular basis. He is not on any anticoagulants. He is allergic to penicillins.   Past Medical History  Diagnosis Date  . Dislocated shoulder    Past Surgical History  Procedure Laterality Date  . Left shoulder dislocation Left    History reviewed. No pertinent family history. History  Substance Use Topics  . Smoking status: Current Every Day Smoker    Types: Cigarettes  . Smokeless tobacco: Not on file  . Alcohol Use: Yes    Review of Systems  Respiratory: Negative for chest tightness and shortness of breath.   Cardiovascular: Negative for chest pain.  Gastrointestinal: Negative for abdominal pain.  Musculoskeletal:       Left shoulder pain  Neurological: Negative for dizziness,  light-headedness and headaches.  All other systems reviewed and are negative.     Allergies  Penicillins and Versed  Home Medications   Prior to Admission medications   Not on File   Triage Vitals: BP 163/110  Pulse 71  Temp(Src) 97.5 F (36.4 C) (Oral)  Resp 18  Ht 5\' 10"  (1.778 m)  Wt 198 lb (89.812 kg)  BMI 28.41 kg/m2  SpO2 97% Physical Exam  Nursing note and vitals reviewed. Constitutional: He is oriented to person, place, and time. He appears well-developed and well-nourished.  HENT:  Head: Normocephalic.  Mouth/Throat: Oropharynx is clear and moist.  Ecchymosis about the right orbit without significant swelling, midface stable  Eyes: EOM are normal. Pupils are equal, round, and reactive to light.  Neck: Neck supple.  No midline c spine tenderness  Cardiovascular: Normal rate, regular rhythm and normal heart sounds.   No murmur heard. Pulmonary/Chest: Effort normal and breath sounds normal. No respiratory distress. He has no wheezes.  Abdominal: Soft. Bowel sounds are normal. There is no tenderness.  Musculoskeletal: He exhibits no edema.  Holding left shoulder in adducted position, decreased ROM, indentation over the left glenoid  Neurological: He is alert and oriented to person, place, and time.  Skin: Skin is warm and dry.  Psychiatric: He has a normal mood and affect.    ED Course  Reduction of dislocation Date/Time: 06/28/2013 10:19 AM Performed by: Ross MarcusHORTON, Kalii Chesmore, F Authorized by: Greg BatonHORTON, Jelisha Weed, F  Consent given by: patient Patient identity confirmed: verbally with patient Time out: Immediately prior to procedure a "time out" was called to verify the correct patient, procedure, equipment, support staff and site/side marked as required. Local anesthesia used: no Patient sedated: yes Comments: Multiple attempts to reduce left shoulder dislocation including traction countertraction, external rotation, and Stimson maneuver were unsuccessful.    (including critical care time)  Procedural sedation Performed by: Ross MarcusHORTON, Joee Iovine, F Consent: Verbal consent obtained. Risks and benefits: risks, benefits and alternatives were discussed Required items: required blood products, implants, devices, and special equipment available Patient identity confirmed: arm band and provided demographic data Time out: Immediately prior to procedure a "time out" was called to verify the correct patient, procedure, equipment, support staff and site/side marked as required.  Sedation type: moderate (conscious) sedation NPO time confirmed and considedered  Sedatives: PROPOFOL and ketamine  Physician Time at Bedside: 30 min  Vitals: Vital signs were monitored during sedation. Cardiac Monitor, pulse oximeter Patient tolerance: Patient tolerated the procedure well with no immediate complications. Comments: Pt with uneventful recovered. Returned to pre-procedural sedation baseline    DIAGNOSTIC STUDIES: Oxygen Saturation is 97% on room air, adequate by my interpretation.    COORDINATION OF CARE: 7:35 AM- Patient informed of current plan for treatment and evaluation and agrees with plan at this time.    Labs Review Labs Reviewed  CBC WITH DIFFERENTIAL - Abnormal; Notable for the following:    WBC 11.5 (*)    Neutrophils Relative % 89 (*)    Neutro Abs 10.2 (*)    Lymphocytes Relative 6 (*)    All other components within normal limits  BASIC METABOLIC PANEL - Abnormal; Notable for the following:    Glucose, Bld 119 (*)    Calcium 8.3 (*)    All other components within normal limits    Imaging Review Dg Shoulder Left  06/28/2013   CLINICAL DATA:  Fall.  History of dislocations.  EXAM: LEFT SHOULDER - 2+ VIEW  COMPARISON:  05/05/2013 and previous  FINDINGS: There is recurrent anterior inferior dislocation of the humeral head relative to the glenoid. Large Hill-Sachs defect of the humeral head remains evident. Chronic bony fragments evident in  the soft tissues laterally. Multiple vascular calcifications in the left supraclavicular region as seen previously.  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   Electronically Signed   By: Paulina FusiMark  Shogry M.D.   On: 06/28/2013 07:54     EKG Interpretation   Date/Time:  Friday June 28 2013 10:22:18 EDT Ventricular Rate:  61 PR Interval:  163 QRS Duration: 91 QT Interval:  427 QTC Calculation: 430 R Axis:   -39 Text Interpretation:  Sinus rhythm Probable left atrial enlargement Left  axis deviation Anterior infarct, old No prior for comparison Confirmed by  Brianna Bennett  MD, Rihana Kiddy ([id number]) on 06/28/2013 10:28:55 AM      MDM   Final diagnoses:  Shoulder dislocation, recurrent    Patient presents with left shoulder dislocation. He is otherwise nontoxic on exam. He does have ecchymosis about the right eye. Patient has been dislocated for approximately 8 hours. X-ray confirms dislocation. Patient was sedated and multiple attempts to reduce failed. Orthopedics consulted. Will evaluate patient. They have requested preop lab work and EKG.  I personally performed the services described in this documentation, which was scribed in my presence. The recorded information has been reviewed and is accurate.    Greg Batonourtney F Karlen Barbar, MD 06/28/13 1208

## 2013-06-28 NOTE — OR Nursing (Signed)
States used cocaine , THC 1 month ago.

## 2013-07-01 ENCOUNTER — Telehealth: Payer: Self-pay | Admitting: Orthopedic Surgery

## 2013-07-01 NOTE — Telephone Encounter (Signed)
Per Dr [name], patient was aware to follow up in office today, 07/01/13, for hospital Emergency room follow up (new injury to shoulder). (We were to do a "work-in apopintment slot.  I called patient, as he did not come, to offer appointment for tomorrow.  Left voice message.

## 2013-07-04 NOTE — Telephone Encounter (Signed)
Patient and girlfriend called back today, 07/04/13, inquiring about appointment, which, as noted, was to be for Monday, 07/01/13, per Dr Mort SawyersHarrison's hospital discharge instructions.  Relayed to patient that Dr [name] is in clinic, and that we will call back to advise.

## 2013-07-04 NOTE — Telephone Encounter (Signed)
Spoke with Dr [name] at time of this call; as of 5:15 07/04/13, awaiting his advice.  Patient aware.

## 2013-07-14 ENCOUNTER — Emergency Department (HOSPITAL_COMMUNITY)
Admission: EM | Admit: 2013-07-14 | Discharge: 2013-07-14 | Disposition: A | Discharge status: Home / Self Care | Payer: Self-pay | Attending: Emergency Medicine | Admitting: Emergency Medicine

## 2013-07-14 ENCOUNTER — Emergency Department (HOSPITAL_COMMUNITY): Payer: Self-pay

## 2013-07-14 ENCOUNTER — Encounter (HOSPITAL_COMMUNITY): Payer: Self-pay | Admitting: Emergency Medicine

## 2013-07-14 DIAGNOSIS — S43005A Unspecified dislocation of left shoulder joint, initial encounter: Secondary | ICD-10-CM

## 2013-07-14 DIAGNOSIS — F17200 Nicotine dependence, unspecified, uncomplicated: Secondary | ICD-10-CM | POA: Insufficient documentation

## 2013-07-14 DIAGNOSIS — X500XXA Overexertion from strenuous movement or load, initial encounter: Secondary | ICD-10-CM | POA: Insufficient documentation

## 2013-07-14 DIAGNOSIS — Z880 Allergy status to penicillin: Secondary | ICD-10-CM | POA: Insufficient documentation

## 2013-07-14 DIAGNOSIS — Y9389 Activity, other specified: Secondary | ICD-10-CM | POA: Insufficient documentation

## 2013-07-14 DIAGNOSIS — S43006A Unspecified dislocation of unspecified shoulder joint, initial encounter: Secondary | ICD-10-CM | POA: Insufficient documentation

## 2013-07-14 DIAGNOSIS — Y9289 Other specified places as the place of occurrence of the external cause: Secondary | ICD-10-CM | POA: Insufficient documentation

## 2013-07-14 MED ORDER — LORAZEPAM 2 MG/ML IJ SOLN
INTRAMUSCULAR | Status: AC
  Filled 2013-07-14: qty 1

## 2013-07-14 MED ORDER — LORAZEPAM 2 MG/ML IJ SOLN
0.5000 mg | Freq: Once | INTRAMUSCULAR | Status: AC
  Administered 2013-07-14: 0.5 mg via INTRAMUSCULAR

## 2013-07-14 MED ORDER — HYDROCODONE-ACETAMINOPHEN 5-325 MG PO TABS: 1.0000 | ORAL_TABLET | Freq: Four times a day (QID) | ORAL | Status: DC | PRN

## 2013-07-14 NOTE — Telephone Encounter (Signed)
SCHEDULE F/U AT HIS CONVENIENCE ASAP WHEN IM IN THE OFFICE

## 2013-07-14 NOTE — Discharge Instructions (Signed)
As discussed, it is very important that you follow up with our orthopedist colleagues in [place] for appropriate ongoing management of your chronic shoulder issue.  Please take ibuprofen, 600 mg, 3 times daily for the next 2 days in addition to using ice packs, 3 times daily.  Return here for concerning changes in your condition. Shoulder Dislocation  Shoulder dislocation is when your upper arm bone (humerus) is forced out of your shoulder joint. Your doctor will put your shoulder back into the joint by pulling on your arm or through surgery. Your arm will be placed in a shoulder immobilizer or sling. The shoulder immobilizer or sling holds your shoulder in place while it heals. HOME CARE   Rest your injured joint. Do not move it until instructed to do so.  Put ice on your injured joint as told by your doctor.  Put ice in a plastic bag.  Place a towel between your skin and the bag.  Leave the ice on for 15-20 minutes at a time, every 2 hours while you are awake.  Only take medicines as told by your doctor.  Squeeze a ball to exercise your hand. GET HELP RIGHT AWAY IF:   Your splint or sling becomes damaged.  Your pain becomes worse, not better.  You lose feeling in your arm or hand.  Your arm or hand becomes white or cold. MAKE SURE YOU:   Understand these instructions.  Will watch your condition.  Will get help right away if you are not doing well or get worse. Document Released: 03/28/2011 Document Reviewed: 03/28/2011 [facility] Patient Information 2015 [employer] [place]. This information is not intended to replace advice given to you by your health care provider. Make sure you discuss any questions you have with your health care provider.

## 2013-07-14 NOTE — ED Provider Notes (Signed)
CSN: [id number]     Arrival date & time 07/14/13  1840 History   First MD Initiated Contact with Patient 07/14/13 1909     Chief Complaint  Patient presents with  . dislocated shoulder      (Consider location/radiation/quality/duration/timing/severity/associated sxs/prior Treatment) HPI Patient presents with new left shoulder pain. Patient has a history of prior dislocation of the shoulder. Today, approximately 2 hours prior to my evaluation the patient had sudden strain of the shoulder, felt the severe onset of pain throughout the shoulder, and since that time has had incapacitating pain. No relief with anything. No other injuries, no other complaints.  Past Medical History  Diagnosis Date  . Dislocated shoulder    Past Surgical History  Procedure Laterality Date  . Left shoulder dislocation Left   . Shoulder closed reduction Left 06/28/2013    Procedure: CLOSED REDUCTION LEFT SHOULDER UNDER ANESTHESIA;  Surgeon: Vickki HearingStanley E Harrison, MD;  Location: [facility];  Service: Orthopedics;  Laterality: Left;   History reviewed. No pertinent family history. History  Substance Use Topics  . Smoking status: Current Every Day Smoker    Types: Cigarettes  . Smokeless tobacco: Not on file  . Alcohol Use: Yes    Review of Systems  Constitutional:       Per HPI, otherwise negative  HENT:       Per HPI, otherwise negative  Respiratory:       Per HPI, otherwise negative  Cardiovascular:       Per HPI, otherwise negative  Gastrointestinal: Negative for vomiting.  Endocrine:       Negative aside from HPI  Genitourinary:       Neg aside from HPI   Musculoskeletal:       Per HPI, otherwise negative  Skin: Negative.   Neurological: Negative for syncope.      Allergies  Penicillins  Home Medications   Prior to Admission medications   Medication Sig Start Date End Date Taking? Authorizing Provider  ibuprofen (ADVIL,MOTRIN) 200 MG tablet Take 400 mg by mouth every 6 (six) hours as  needed.   Yes Historical Provider, MD  HYDROcodone-acetaminophen (NORCO/VICODIN) 5-325 MG per tablet Take 1 tablet by mouth every 6 (six) hours as needed for moderate pain or severe pain. 07/14/13   Gerhard Munchobert Lockwood, MD   BP 90/66  Pulse 68  Temp(Src) 97.4 F (36.3 C) (Oral)  Resp 20  Ht 5\' 10"  (1.778 m)  Wt 200 lb (90.719 kg)  BMI 28.70 kg/m2  SpO2 94% Physical Exam  Nursing note and vitals reviewed. Constitutional: He is oriented to person, place, and time. He appears well-developed. No distress.  HENT:  Head: Normocephalic and atraumatic.  Eyes: Conjunctivae and EOM are normal.  Cardiovascular: Normal rate and regular rhythm.   Pulmonary/Chest: Effort normal. No stridor. No respiratory distress.  Abdominal: He exhibits no distension.  Musculoskeletal: He exhibits no edema.  Gross deformity of the left shoulder with palpable glenohumeral superior ridge.  Patient is hesitant to flex the elbow secondary to shoulder pain.  The patient has appropriate range of motion and strength in the wrist. Appropriate distal pulses as well.   Neurological: He is alert and oriented to person, place, and time.  Skin: Skin is warm and dry.  Patient has very dirty feet - no rash, no open lesions.  Psychiatric: He has a normal mood and affect.    ED Course  ORTHOPEDIC INJURY TREATMENT Date/Time: 07/14/2013 8:40 PM Performed by: Gerhard MunchLOCKWOOD, ROBERT Authorized by: Gerhard MunchLOCKWOOD, ROBERT Consent:  Verbal consent obtained. The procedure was performed in an emergent situation. Risks and benefits: risks, benefits and alternatives were discussed Consent given by: patient Patient understanding: patient states understanding of the procedure being performed Patient consent: the patient's understanding of the procedure matches consent given Procedure consent: procedure consent matches procedure scheduled Relevant documents: relevant documents present and verified Test results: test results available and properly  labeled Site marked: the operative site was marked Imaging studies: imaging studies available Required items: required blood products, implants, devices, and special equipment available Patient identity confirmed: verbally with patient Time out: Immediately prior to procedure a "time out" was called to verify the correct patient, procedure, equipment, support staff and site/side marked as required. Injury location: shoulder Location details: left shoulder Injury type: dislocation Hill-Sachs deformity: yes (chronic) Chronicity: recurrent Pre-procedure neurovascular assessment: neurovascularly intact Pre-procedure distal perfusion: normal Pre-procedure neurological function: normal Pre-procedure range of motion: reduced Local anesthesia used: no Patient sedated: ativan IM. Manipulation performed: yes Reduction method: physical manipulation and prone positioning with weights. Reduction successful: yes X-ray confirmed reduction: yes Immobilization: sling Post-procedure neurovascular assessment: post-procedure neurovascularly intact Post-procedure distal perfusion: normal Post-procedure neurological function: normal Post-procedure range of motion: unchanged Patient tolerance: Patient tolerated the procedure well with no immediate complications.   (including critical care time) Labs Review Labs Reviewed - No data to display  Imaging Review Dg Shoulder Left  07/14/2013   CLINICAL DATA:  Shoulder injury with pain  EXAM: LEFT SHOULDER - 2+ VIEW  COMPARISON:  06/28/2013  FINDINGS: The glenohumeral and acromioclavicular joints are currently located. There is a large, chronic Hill-Sachs deformity with neighboring chronic ossified bodies or fracture fragments. No acute fracture.  IMPRESSION: 1. No acute osseous findings. 2. Chronic Hill-Sachs deformity.   Electronically Signed   By: Tiburcio PeaJonathan  Watts M.D.   On: 07/14/2013 21:05     MDM   Final diagnoses:  Dislocation of shoulder, left,  initial encounter    Patient presents after suffering shoulder dislocation.  With manual reduction, prone positioning with traction after provision of muscle relaxant, patient had reduction, with no complication. Patient was discharged with instructions to follow up with our orthopedist.    Gerhard Munchobert Lockwood, MD 07/14/13 2122

## 2013-07-14 NOTE — ED Notes (Signed)
Dislocated left shoulder doing yard work, friend attempted to put shoulder back in. Shoulder has been dislocated 5 times since January approx.

## 2013-07-15 NOTE — Telephone Encounter (Signed)
Reached patient and scheduled appointment for next week, 07/23/13, which is patient's preference.

## 2013-07-23 ENCOUNTER — Ambulatory Visit (INDEPENDENT_AMBULATORY_CARE_PROVIDER_SITE_OTHER): Payer: Self-pay | Admitting: Orthopedic Surgery

## 2013-07-23 VITALS — BP 153/90 | Ht 70.0 in | Wt 200.0 lb

## 2013-07-23 DIAGNOSIS — S43006A Unspecified dislocation of unspecified shoulder joint, initial encounter: Secondary | ICD-10-CM

## 2013-07-23 DIAGNOSIS — S43005D Unspecified dislocation of left shoulder joint, subsequent encounter: Secondary | ICD-10-CM

## 2013-07-23 DIAGNOSIS — S43005A Unspecified dislocation of left shoulder joint, initial encounter: Secondary | ICD-10-CM

## 2013-07-23 DIAGNOSIS — Z5189 Encounter for other specified aftercare: Secondary | ICD-10-CM

## 2013-07-23 NOTE — Progress Notes (Signed)
Patient ID: Greg Fry, male   DOB: May 26, 1955, 58 y.o.   MRN: [id number]  Chief Complaint  Patient presents with  . Follow-up    Hospital follow up on left shoulder dislocation reinjury. He was seen in the ER on 07-14-13.    I saw him for unreducible dislocation of the shoulder after delayed presentation on June 12  Vital signs are stable he is oriented x3 his mood is normal he does not appear intoxicated he walks normally has tenderness around the shoulder no swelling the contour the deltoid and acromion look normal. Neurovascular exam normal distally.  Review of systems negative  I reduced his shoulder in the operating room he failed to followup he presented back to the emergency room on the 28th requiring another reduction for dislocation. I spoke to him today at length about compliance. He needs a shoulder replacement he's a candidate for total shoulder which I don't do but he would be a good candidate for I think if he can become compliant stop drinking and stop using drugs. Otherwise I don't think he should have surgery until he can be clean. All see him again 4 weeks he should wear the sling for another week

## 2013-07-23 NOTE — Patient Instructions (Signed)
Wear sling 1 more week

## 2013-08-20 ENCOUNTER — Ambulatory Visit: Payer: Self-pay | Admitting: Orthopedic Surgery

## 2013-08-20 ENCOUNTER — Telehealth: Payer: Self-pay | Admitting: Orthopedic Surgery

## 2013-08-20 NOTE — Telephone Encounter (Signed)
Greg Fry left a message that he has to cancel his appointment for today, 08/20/13 because his arm has come out again and he is on his way to the ER,.

## 2013-12-16 ENCOUNTER — Emergency Department (HOSPITAL_COMMUNITY): Payer: No Typology Code available for payment source

## 2013-12-16 ENCOUNTER — Encounter (HOSPITAL_COMMUNITY): Payer: Self-pay

## 2013-12-16 ENCOUNTER — Emergency Department (HOSPITAL_COMMUNITY)
Admission: EM | Admit: 2013-12-16 | Discharge: 2013-12-16 | Disposition: A | Discharge status: Home / Self Care | Payer: No Typology Code available for payment source | Attending: Emergency Medicine | Admitting: Emergency Medicine

## 2013-12-16 DIAGNOSIS — Y92410 Unspecified street and highway as the place of occurrence of the external cause: Secondary | ICD-10-CM | POA: Diagnosis not present

## 2013-12-16 DIAGNOSIS — Z880 Allergy status to penicillin: Secondary | ICD-10-CM | POA: Diagnosis not present

## 2013-12-16 DIAGNOSIS — Y998 Other external cause status: Secondary | ICD-10-CM | POA: Diagnosis not present

## 2013-12-16 DIAGNOSIS — S3991XA Unspecified injury of abdomen, initial encounter: Secondary | ICD-10-CM | POA: Insufficient documentation

## 2013-12-16 DIAGNOSIS — Y9389 Activity, other specified: Secondary | ICD-10-CM | POA: Diagnosis not present

## 2013-12-16 DIAGNOSIS — S0990XA Unspecified injury of head, initial encounter: Secondary | ICD-10-CM | POA: Diagnosis not present

## 2013-12-16 DIAGNOSIS — S22088A Other fracture of T11-T12 vertebra, initial encounter for closed fracture: Secondary | ICD-10-CM | POA: Insufficient documentation

## 2013-12-16 DIAGNOSIS — Z720 Tobacco use: Secondary | ICD-10-CM | POA: Insufficient documentation

## 2013-12-16 DIAGNOSIS — Z79899 Other long term (current) drug therapy: Secondary | ICD-10-CM | POA: Insufficient documentation

## 2013-12-16 DIAGNOSIS — S161XXA Strain of muscle, fascia and tendon at neck level, initial encounter: Secondary | ICD-10-CM | POA: Diagnosis not present

## 2013-12-16 DIAGNOSIS — S22000A Wedge compression fracture of unspecified thoracic vertebra, initial encounter for closed fracture: Secondary | ICD-10-CM

## 2013-12-16 LAB — BASIC METABOLIC PANEL
Anion gap: 13 (ref 5–15)
BUN: 10 mg/dL (ref 6–23)
CO2: 24 mEq/L (ref 19–32)
Calcium: 9.4 mg/dL (ref 8.4–10.5)
Chloride: 101 mEq/L (ref 96–112)
Creatinine, Ser: 0.78 mg/dL (ref 0.50–1.35)
GLUCOSE: 95 mg/dL (ref 70–99)
Potassium: 4.6 mEq/L (ref 3.7–5.3)
SODIUM: 138 meq/L (ref 137–147)

## 2013-12-16 LAB — CBC WITH DIFFERENTIAL/PLATELET
Basophils Absolute: 0.1 10*3/uL (ref 0.0–0.1)
Basophils Relative: 1 % (ref 0–1)
Eosinophils Absolute: 0.8 10*3/uL — ABNORMAL HIGH (ref 0.0–0.7)
Eosinophils Relative: 8 % — ABNORMAL HIGH (ref 0–5)
HCT: 47.2 % (ref 39.0–52.0)
HEMOGLOBIN: 16 g/dL (ref 13.0–17.0)
LYMPHS PCT: 18 % (ref 12–46)
Lymphs Abs: 1.7 10*3/uL (ref 0.7–4.0)
MCH: 32.3 pg (ref 26.0–34.0)
MCHC: 33.9 g/dL (ref 30.0–36.0)
MCV: 95.2 fL (ref 78.0–100.0)
MONOS PCT: 7 % (ref 3–12)
Monocytes Absolute: 0.7 10*3/uL (ref 0.1–1.0)
Neutro Abs: 5.9 10*3/uL (ref 1.7–7.7)
Neutrophils Relative %: 66 % (ref 43–77)
PLATELETS: 255 10*3/uL (ref 150–400)
RBC: 4.96 MIL/uL (ref 4.22–5.81)
RDW: 12.1 % (ref 11.5–15.5)
WBC: 9.1 10*3/uL (ref 4.0–10.5)

## 2013-12-16 LAB — ETHANOL

## 2013-12-16 MED ORDER — SODIUM CHLORIDE 0.9 % IV SOLN
INTRAVENOUS | Status: DC
  Administered 2013-12-16: 16:00:00 via INTRAVENOUS

## 2013-12-16 MED ORDER — ONDANSETRON HCL 4 MG/2ML IJ SOLN
4.0000 mg | Freq: Once | INTRAMUSCULAR | Status: AC
  Administered 2013-12-16: 4 mg via INTRAVENOUS
  Filled 2013-12-16: qty 2

## 2013-12-16 MED ORDER — FENTANYL CITRATE 0.05 MG/ML IJ SOLN
25.0000 ug | Freq: Once | INTRAMUSCULAR | Status: AC
  Administered 2013-12-16: 25 ug via INTRAVENOUS
  Filled 2013-12-16: qty 2

## 2013-12-16 MED ORDER — SODIUM CHLORIDE 0.9 % IV BOLUS (SEPSIS)
500.0000 mL | Freq: Once | INTRAVENOUS | Status: AC
  Administered 2013-12-16: 500 mL via INTRAVENOUS

## 2013-12-16 MED ORDER — HYDROCODONE-ACETAMINOPHEN 5-325 MG PO TABS: 1.0000 | ORAL_TABLET | Freq: Four times a day (QID) | ORAL | Status: DC | PRN

## 2013-12-16 MED ORDER — IOHEXOL 300 MG/ML  SOLN
100.0000 mL | Freq: Once | INTRAMUSCULAR | Status: AC | PRN
  Administered 2013-12-16: 100 mL via INTRAVENOUS

## 2013-12-16 NOTE — ED Notes (Signed)
Pt c/o pain in the thoracic area of his back, his neck and the back of his head. EMS reports no hematomas or blood on inspection at scene.
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
## 2013-12-16 NOTE — ED Notes (Signed)
Pt pulled his own IV out. Catheter is intact.

## 2013-12-16 NOTE — Discharge Instructions (Signed)
Follow-up with orthopedics at your request Dr. Mort SawyersHarrison's information provided. Take pain medicine as needed. Main concern is for the thoracic compression fracture which possibly could be old. Expect to be sore and stiff the next couple days. Return for any new or worse symptoms. Work note provided.
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
## 2014-01-02 ENCOUNTER — Ambulatory Visit (INDEPENDENT_AMBULATORY_CARE_PROVIDER_SITE_OTHER): Payer: Self-pay | Admitting: Orthopedic Surgery

## 2014-01-02 VITALS — BP 145/100 | Ht 70.0 in | Wt 200.0 lb

## 2014-01-02 DIAGNOSIS — S22009A Unspecified fracture of unspecified thoracic vertebra, initial encounter for closed fracture: Secondary | ICD-10-CM

## 2014-01-02 MED ORDER — HYDROCODONE-ACETAMINOPHEN 5-325 MG PO TABS: 1.0000 | ORAL_TABLET | Freq: Four times a day (QID) | ORAL | Status: DC | PRN
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
## 2014-01-02 NOTE — Patient Instructions (Signed)
BRACE FOR 6 WEEKS

## 2014-01-07 ENCOUNTER — Encounter: Payer: Self-pay | Admitting: Orthopedic Surgery

## 2014-02-13 ENCOUNTER — Encounter: Payer: Self-pay | Admitting: Orthopedic Surgery

## 2014-02-13 ENCOUNTER — Ambulatory Visit (INDEPENDENT_AMBULATORY_CARE_PROVIDER_SITE_OTHER): Payer: PRIVATE HEALTH INSURANCE

## 2014-02-13 ENCOUNTER — Ambulatory Visit (INDEPENDENT_AMBULATORY_CARE_PROVIDER_SITE_OTHER): Payer: Self-pay | Admitting: Orthopedic Surgery

## 2014-02-13 VITALS — BP 160/113 | Ht 70.0 in | Wt 200.0 lb

## 2014-02-13 DIAGNOSIS — S22009D Unspecified fracture of unspecified thoracic vertebra, subsequent encounter for fracture with routine healing: Secondary | ICD-10-CM

## 2014-02-13 DIAGNOSIS — S22009A Unspecified fracture of unspecified thoracic vertebra, initial encounter for closed fracture: Secondary | ICD-10-CM | POA: Insufficient documentation

## 2014-02-13 MED ORDER — HYDROCODONE-ACETAMINOPHEN 5-325 MG PO TABS: 1.0000 | ORAL_TABLET | Freq: Three times a day (TID) | ORAL | Status: DC | PRN

## 2014-02-13 NOTE — Patient Instructions (Signed)
Wean yourself from the brace   Take pain meds as needed
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
## 2014-11-02 IMAGING — CT CT SHOULDER*L* W/O CM
5 of 6 series · 13 of 33 positions shown, 15 images · non-contrast
Comparison: Plain films and CT left shoulder 01/22/2013.

CLINICAL DATA: History of left shoulder dislocation.

EXAM:
CT OF THE LEFT SHOULDER WITHOUT CONTRAST
TECHNIQUE: Multidetector CT imaging was performed according to the standard
protocol. Multiplanar CT image reconstructions were also generated.

[Series 2: shoulder axial bone 1.5 recon at 2 · axial · 0.43mm/px · z∈[-175,-115]mm · 2 of 92 slices shown]
[im 31/92  bone]
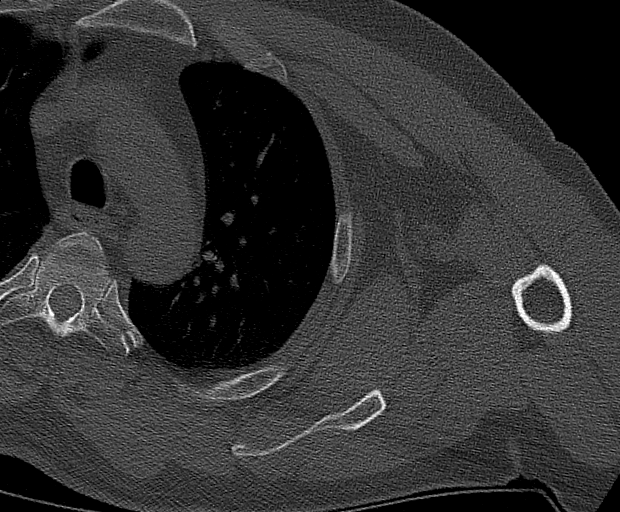
[im 61/92  bone]
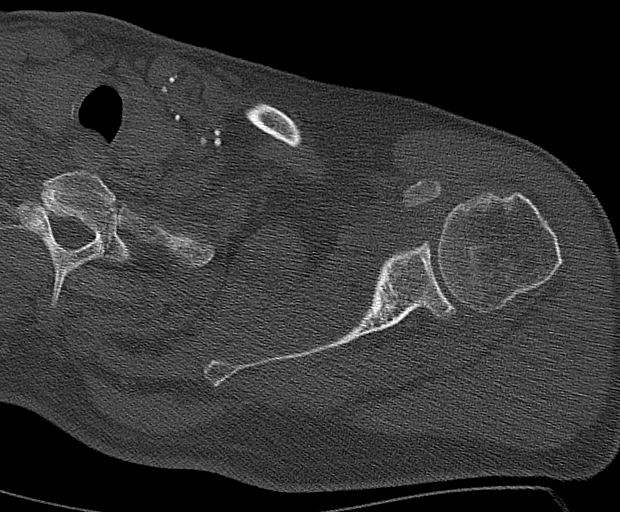

[Series 3: mpr cor bone 2.0 shoulder · coronal · 0.39mm/px · 1 of 100 slices shown]
[im 50/100  bone]
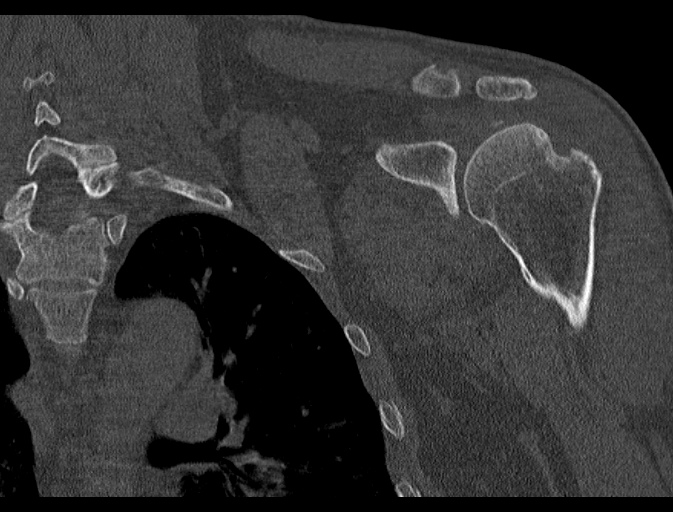

[Series 5: shoulder axial st 2.0 · axial · 0.37mm/px · z∈[-189,-97]mm · 3 of 93 slices shown, 4 images]
[im 24/93  soft-tissue]
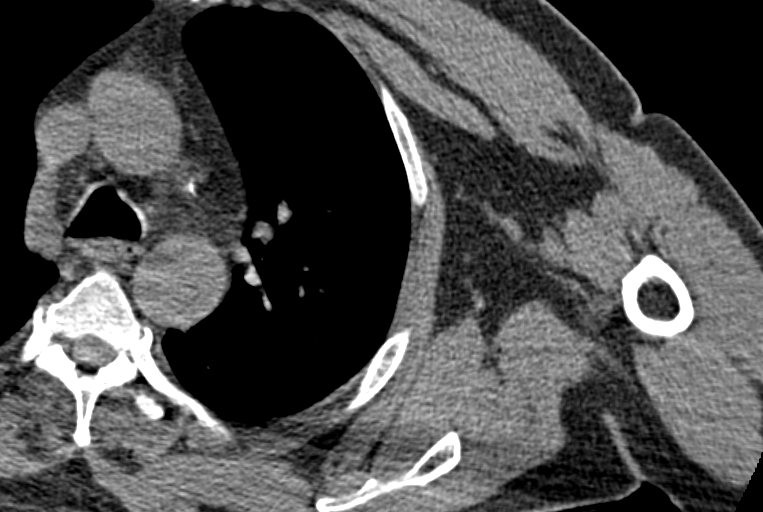
[im 24/93  bone]
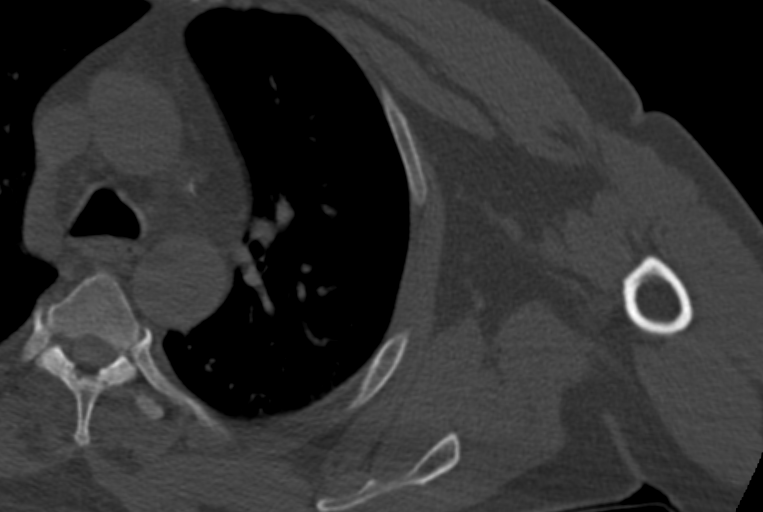
[im 47/93  bone]
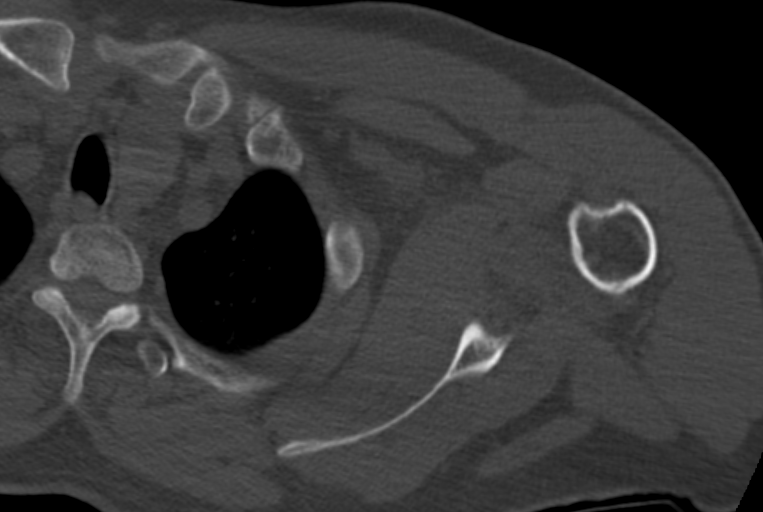
[im 70/93  bone]
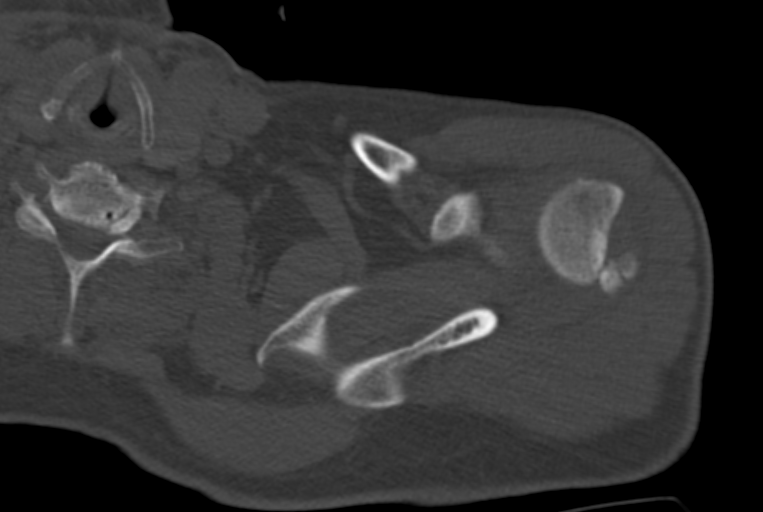

[Series 7: shoulder sagittal st 2.0 · sagittal · 0.41mm/px · 5 of 134 slices shown, 6 images]
[im 45/134  bone]
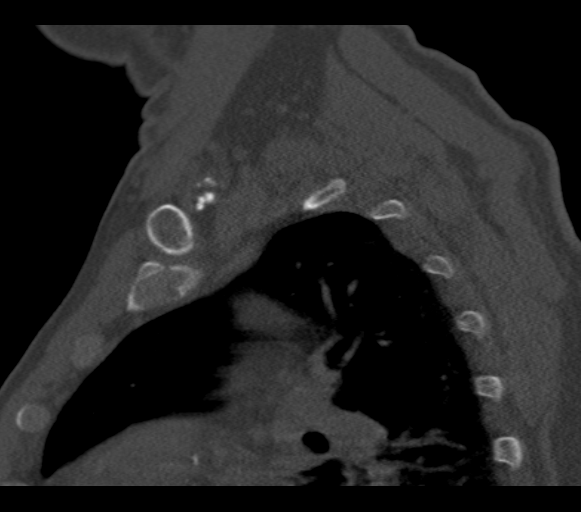
[im 56/134  bone]
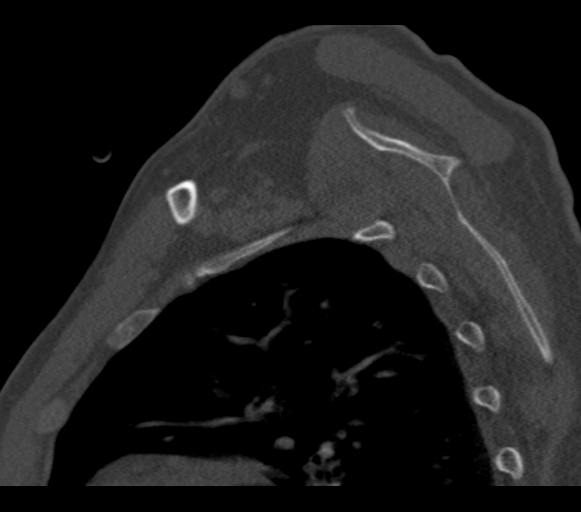
[im 67/134  soft-tissue]
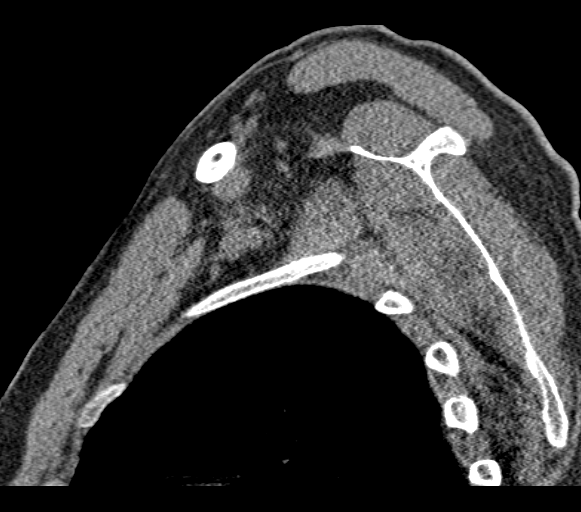
[im 67/134  bone]
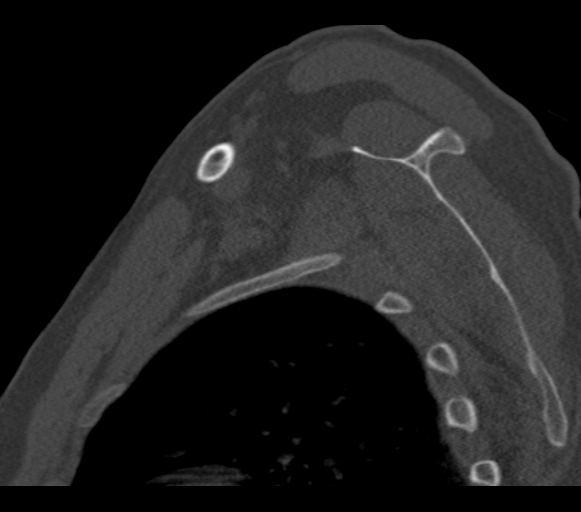
[im 78/134  bone]
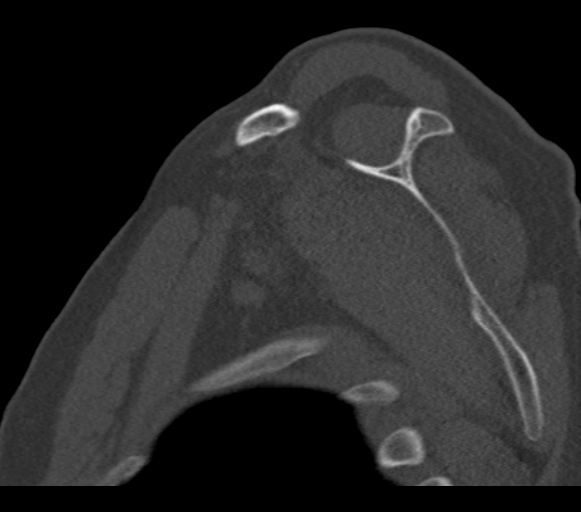
[im 89/134  bone]
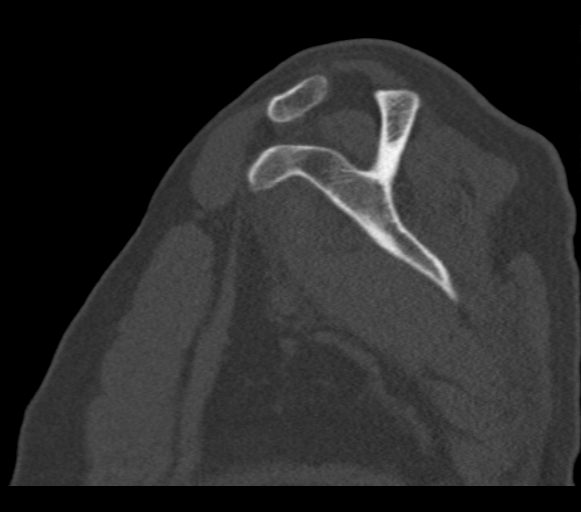

[Series 16: shoulder axial soft tissue 2mm · axial · 0.27mm/px · z∈[-210,-166]mm · 2 of 95 slices shown]
[im 24/95  soft-tissue]
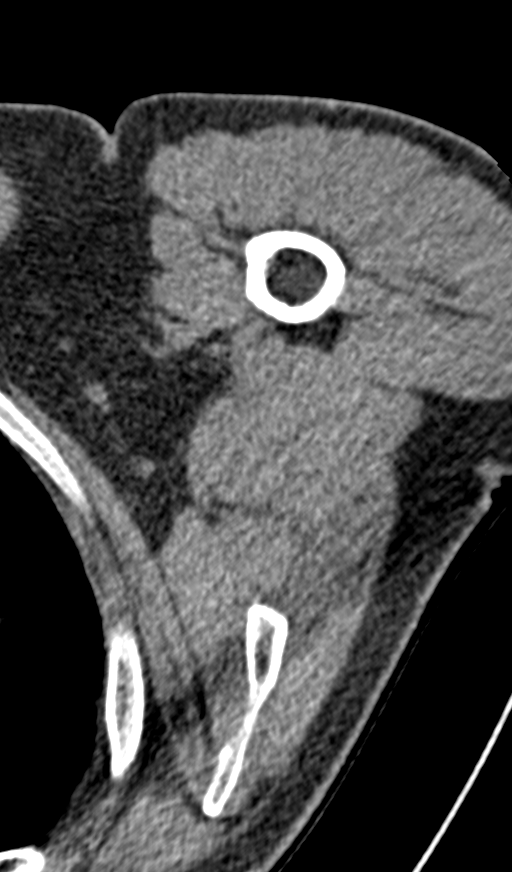
[im 48/95  soft-tissue]
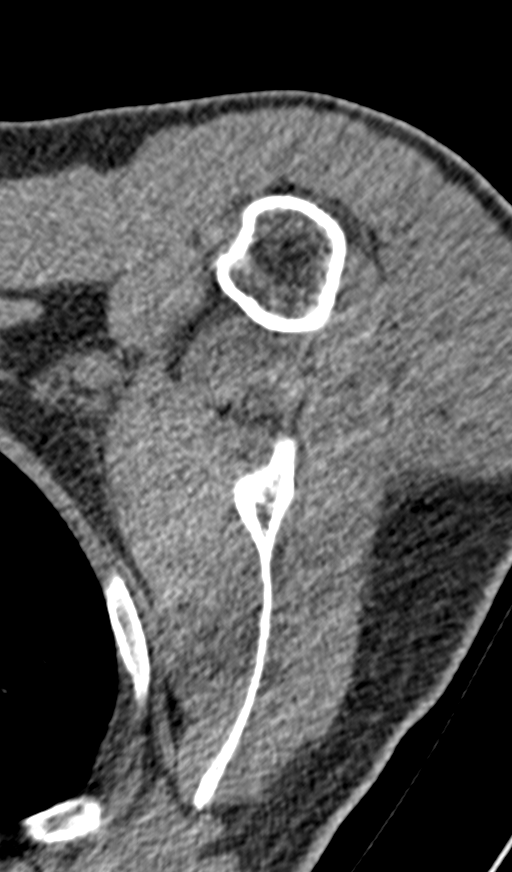

[13 of 33 positions shown; findings below may reference images not displayed]

FINDINGS: The left shoulder is reduced. A large Hill-Sachs deformity with
multiple bony fragments about it is again seen. There are some small
loose bony fragments within the joint. One of the most conspicuous
is anterior to the humeral head and measures 0.6 cm in diameter. No
Bankart lesion is identified. Small osteophyte off the medial
humeral head is noted. There is minimal acromioclavicular
degenerative change. As visualized by CT scan, the rotator cuff
appears intact. Lung parenchyma demonstrates emphysematous disease
but is otherwise unremarkable. Small calcifications are seen just
above the head of the left clavicle possibly from old trauma.
IMPRESSION: The left shoulder is located with a large Hill-Sachs deformity
identified. No bony Bankart is seen.

Emphysema.

## 2022-07-05 ENCOUNTER — Ambulatory Visit: Payer: Self-pay | Admitting: Nurse Practitioner

## 2022-07-07 ENCOUNTER — Ambulatory Visit (INDEPENDENT_AMBULATORY_CARE_PROVIDER_SITE_OTHER): Payer: Medicare Other | Admitting: Nurse Practitioner

## 2022-07-07 ENCOUNTER — Encounter: Payer: Self-pay | Admitting: Nurse Practitioner

## 2022-07-07 ENCOUNTER — Other Ambulatory Visit: Payer: Self-pay | Admitting: Nurse Practitioner

## 2022-07-07 VITALS — BP 148/80 | HR 73 | Temp 97.3°F | Ht 70.5 in | Wt 233.0 lb

## 2022-07-07 DIAGNOSIS — F32A Depression, unspecified: Secondary | ICD-10-CM

## 2022-07-07 DIAGNOSIS — Z125 Encounter for screening for malignant neoplasm of prostate: Secondary | ICD-10-CM

## 2022-07-07 DIAGNOSIS — Z13220 Encounter for screening for lipoid disorders: Secondary | ICD-10-CM | POA: Diagnosis not present

## 2022-07-07 DIAGNOSIS — R351 Nocturia: Secondary | ICD-10-CM

## 2022-07-07 DIAGNOSIS — Z0000 Encounter for general adult medical examination without abnormal findings: Secondary | ICD-10-CM

## 2022-07-07 DIAGNOSIS — Z1329 Encounter for screening for other suspected endocrine disorder: Secondary | ICD-10-CM

## 2022-07-07 MED ORDER — ESCITALOPRAM OXALATE 10 MG PO TABS: 10.0000 mg | ORAL_TABLET | Freq: Every day | ORAL | 0 refills | Status: DC

## 2022-07-07 NOTE — Progress Notes (Signed)
@Patient  ID: Greg Fry, male    DOB: 11-17-1955, 67 y.o.   MRN: [id number]  Chief Complaint  Patient presents with   Establish Care    Referring provider: Benita Stabile, MD   HPI   Patient presents today to establish care.  He is here today with his sister.  Blood pressure was slightly elevated in office today.  Patient was very nervous about being in a doctor's office.  We will monitor this.  Patient is very apprehensive about being placed on medications.  He did mention that he has been depressed.  He has retired and has had some deaths in the family so he has become very sedentary and does not do the things that he used to like to do like gardening.  We discussed that we can trial a low-dose depression medication.  Patient is okay with this.  We will check labs today.  It has been several years since patient has been seen by PCP. Denies f/c/s, n/v/d, hemoptysis, PND, leg swelling Denies chest pain or edema      Allergies  Allergen Reactions   Penicillins Other (See Comments)    Child hood reaction     There is no immunization history on file for this patient.  Past Medical History:  Diagnosis Date   Dislocated shoulder     Tobacco History: Social History   Tobacco Use  Smoking Status Every Day   Types: Cigarettes  Smokeless Tobacco Not on file   Ready to quit: Not Answered Counseling given: Not Answered   Outpatient Encounter Medications as of 07/07/2022  Medication Sig   aspirin-sod bicarb-citric acid (ALKA-SELTZER) 325 MG TBEF tablet Take 650 mg by mouth every 6 (six) hours as needed (cold).   diphenhydrAMINE (BENADRYL) 25 MG tablet Take 25-50 mg by mouth every 6 (six) hours as needed for allergies.   escitalopram (LEXAPRO) 10 MG tablet Take 1 tablet (10 mg total) by mouth daily.   ibuprofen (ADVIL,MOTRIN) 200 MG tablet Take 400 mg by mouth every 6 (six) hours as needed for headache or moderate pain.    Aspirin-Acetaminophen-Caffeine (GOODY HEADACHE PO)  Take 1 Package by mouth daily as needed (headache). (Patient not taking: Reported on 07/07/2022)   HYDROcodone-acetaminophen (NORCO) 5-325 MG per tablet Take 1 tablet by mouth every 8 (eight) hours as needed for moderate pain. (Patient not taking: Reported on 07/07/2022)   No facility-administered encounter medications on file as of 07/07/2022.     Review of Systems  Review of Systems  Constitutional: Negative.   HENT: Negative.    Cardiovascular: Negative.   Gastrointestinal: Negative.   Allergic/Immunologic: Negative.   Neurological: Negative.   Psychiatric/Behavioral: Negative.         Physical Exam  BP (!) 148/80   Pulse 73   Temp (!) 97.3 F (36.3 C)   Ht 5' 10.5" (1.791 m) Comment: per pt  Wt 233 lb (105.7 kg)   SpO2 100%   BMI 32.96 kg/m   Wt Readings from Last 5 Encounters:  07/07/22 233 lb (105.7 kg)  02/13/14 200 lb (90.7 kg)  01/02/14 200 lb (90.7 kg)  12/16/13 200 lb (90.7 kg)  07/23/13 200 lb (90.7 kg)     Physical Exam Vitals and nursing note reviewed.  Constitutional:      General: He is not in acute distress.    Appearance: He is well-developed.  Cardiovascular:     Rate and Rhythm: Normal rate and regular rhythm.  Pulmonary:     Effort:  Pulmonary effort is normal.     Breath sounds: Normal breath sounds.  Skin:    General: Skin is warm and dry.  Neurological:     Mental Status: He is alert and oriented to person, place, and time.      Lab Results:  CBC    Component Value Date/Time   WBC 9.1 12/16/2013 1532   RBC 4.96 12/16/2013 1532   HGB 16.0 12/16/2013 1532   HCT 47.2 12/16/2013 1532   PLT 255 12/16/2013 1532   MCV 95.2 12/16/2013 1532   MCH 32.3 12/16/2013 1532   MCHC 33.9 12/16/2013 1532   RDW 12.1 12/16/2013 1532   LYMPHSABS 1.7 12/16/2013 1532   MONOABS 0.7 12/16/2013 1532   EOSABS 0.8 (H) 12/16/2013 1532   BASOSABS 0.1 12/16/2013 1532    BMET    Component Value Date/Time   NA 138 12/16/2013 1532   K 4.6  12/16/2013 1532   CL 101 12/16/2013 1532   CO2 24 12/16/2013 1532   GLUCOSE 95 12/16/2013 1532   BUN 10 12/16/2013 1532   CREATININE 0.78 12/16/2013 1532   CALCIUM 9.4 12/16/2013 1532   GFRNONAA >90 12/16/2013 1532   GFRAA >90 12/16/2013 1532     Assessment & Plan:   Thyroid disorder screen - TSH  2. Prostate cancer screening  - PSA  3. Lipid screening  - Lipid Panel  4. Routine health maintenance  - CBC - Comprehensive metabolic panel  5. Nocturia more than twice per night  - CBC - Comprehensive metabolic panel - PSA  6. Depression, unspecified depression type  - escitalopram (LEXAPRO) 10 MG tablet; Take 1 tablet (10 mg total) by mouth daily.  Dispense: 30 tablet; Refill: 0   Follow up:  Follow up in 6 months     [name], NP 07/08/2022

## 2022-07-07 NOTE — Patient Instructions (Signed)
1. Thyroid disorder screen  - TSH  2. Prostate cancer screening  - PSA  3. Lipid screening  - Lipid Panel  4. Routine health maintenance  - CBC - Comprehensive metabolic panel  5. Nocturia more than twice per night  - CBC - Comprehensive metabolic panel - PSA  6. Depression, unspecified depression type  - escitalopram (LEXAPRO) 10 MG tablet; Take 1 tablet (10 mg total) by mouth daily.  Dispense: 30 tablet; Refill: 0   Follow up:  Follow up in 6 months

## 2022-07-08 DIAGNOSIS — Z1329 Encounter for screening for other suspected endocrine disorder: Secondary | ICD-10-CM | POA: Insufficient documentation

## 2022-07-08 LAB — COMPREHENSIVE METABOLIC PANEL
ALT: 20 IU/L (ref 0–44)
AST: 16 IU/L (ref 0–40)
Albumin: 4.2 g/dL (ref 3.9–4.9)
Alkaline Phosphatase: 188 IU/L — ABNORMAL HIGH (ref 44–121)
BUN/Creatinine Ratio: 9 — ABNORMAL LOW (ref 10–24)
BUN: 9 mg/dL (ref 8–27)
Bilirubin Total: 0.4 mg/dL (ref 0.0–1.2)
CO2: 22 mmol/L (ref 20–29)
Calcium: 9.6 mg/dL (ref 8.6–10.2)
Chloride: 104 mmol/L (ref 96–106)
Creatinine, Ser: 0.99 mg/dL (ref 0.76–1.27)
Globulin, Total: 2.8 g/dL (ref 1.5–4.5)
Glucose: 123 mg/dL — ABNORMAL HIGH (ref 70–99)
Potassium: 4.6 mmol/L (ref 3.5–5.2)
Sodium: 140 mmol/L (ref 134–144)
Total Protein: 7 g/dL (ref 6.0–8.5)
eGFR: 85 mL/min/{1.73_m2} (ref >59)

## 2022-07-08 LAB — PSA: Prostate Specific Ag, Serum: 1.1 ng/mL (ref 0.0–4.0)

## 2022-07-08 LAB — TSH: TSH: 0.375 u[IU]/mL — ABNORMAL LOW (ref 0.450–4.500)

## 2022-07-08 LAB — CBC
Hematocrit: 48 % (ref 37.5–51.0)
Hemoglobin: 16.3 g/dL (ref 13.0–17.7)
MCH: 31.7 pg (ref 26.6–33.0)
MCHC: 34 g/dL (ref 31.5–35.7)
MCV: 93 fL (ref 79–97)
Platelets: 237 10*3/uL (ref 150–450)
RBC: 5.15 x10E6/uL (ref 4.14–5.80)
RDW: 12.4 % (ref 11.6–15.4)
WBC: 9 10*3/uL (ref 3.4–10.8)

## 2022-07-08 LAB — LIPID PANEL
Chol/HDL Ratio: 3 ratio (ref 0.0–5.0)
Cholesterol, Total: 142 mg/dL (ref 100–199)
HDL: 47 mg/dL (ref >39)
LDL Chol Calc (NIH): 74 mg/dL (ref 0–99)
Triglycerides: 118 mg/dL (ref 0–149)
VLDL Cholesterol Cal: 21 mg/dL (ref 5–40)

## 2022-07-08 NOTE — Assessment & Plan Note (Signed)
-   TSH  2. Prostate cancer screening  - PSA  3. Lipid screening  - Lipid Panel  4. Routine health maintenance  - CBC - Comprehensive metabolic panel  5. Nocturia more than twice per night  - CBC - Comprehensive metabolic panel - PSA  6. Depression, unspecified depression type  - escitalopram (LEXAPRO) 10 MG tablet; Take 1 tablet (10 mg total) by mouth daily.  Dispense: 30 tablet; Refill: 0   Follow up:  Follow up in 6 months

## 2022-07-11 ENCOUNTER — Ambulatory Visit: Payer: Self-pay

## 2022-09-29 ENCOUNTER — Ambulatory Visit (INDEPENDENT_AMBULATORY_CARE_PROVIDER_SITE_OTHER): Payer: Medicare Other | Admitting: Nurse Practitioner

## 2022-09-29 ENCOUNTER — Encounter: Payer: Self-pay | Admitting: Nurse Practitioner

## 2022-09-29 VITALS — BP 148/78 | HR 80 | Temp 97.1°F | Ht 70.5 in | Wt 233.6 lb

## 2022-09-29 DIAGNOSIS — E0590 Thyrotoxicosis, unspecified without thyrotoxic crisis or storm: Secondary | ICD-10-CM

## 2022-09-29 DIAGNOSIS — R7989 Other specified abnormal findings of blood chemistry: Secondary | ICD-10-CM | POA: Diagnosis not present

## 2022-09-29 NOTE — Assessment & Plan Note (Signed)
-   Thyroid Panel With TSH  Follow up:  Follow up in 3 months 

## 2022-09-29 NOTE — Progress Notes (Signed)
   Subjective   @Patient  ID: Greg Fry, male    DOB: 1955-03-15, 67 y.o.   MRN: [id number]  Chief Complaint  Patient presents with   Thyroid Problem    Referring provider: [name], NP  Greg Fry is a 67 y.o. male with Past Medical History: No date: Dislocated shoulder   Thyroid Problem Presents for follow-up visit. Symptoms include anxiety, depressed mood and fatigue. Patient reports no palpitations or tremors.   Denies f/c/s, n/v/d, hemoptysis, PND, leg swelling Denies chest pain or edema   Allergies  Allergen Reactions   Penicillins Other (See Comments)    Child hood reaction     There is no immunization history on file for this patient.  Past Medical History:  Diagnosis Date   Dislocated shoulder     Tobacco History: Social History   Tobacco Use  Smoking Status Every Day   Types: Cigarettes  Smokeless Tobacco Not on file   Ready to quit: Not Answered Counseling given: Not Answered   Outpatient Encounter Medications as of 09/29/2022  Medication Sig   ibuprofen (ADVIL,MOTRIN) 200 MG tablet Take 400 mg by mouth every 6 (six) hours as needed for headache or moderate pain.    aspirin-sod bicarb-citric acid (ALKA-SELTZER) 325 MG TBEF tablet Take 650 mg by mouth every 6 (six) hours as needed (cold). (Patient not taking: Reported on 09/29/2022)   diphenhydrAMINE (BENADRYL) 25 MG tablet Take 25-50 mg by mouth every 6 (six) hours as needed for allergies. (Patient not taking: Reported on 09/29/2022)   escitalopram (LEXAPRO) 10 MG tablet Take 1 tablet (10 mg total) by mouth daily. (Patient not taking: Reported on 09/29/2022)   [DISCONTINUED] Aspirin-Acetaminophen-Caffeine (GOODY HEADACHE PO) Take 1 Package by mouth daily as needed (headache). (Patient not taking: Reported on 07/07/2022)   [DISCONTINUED] HYDROcodone-acetaminophen (NORCO) 5-325 MG per tablet Take 1 tablet by mouth every 8 (eight) hours as needed for moderate pain. (Patient not taking: Reported on  07/07/2022)   No facility-administered encounter medications on file as of 09/29/2022.    Review of Systems  Review of Systems  Constitutional:  Positive for fatigue.  Cardiovascular:  Negative for palpitations.  Neurological:  Negative for tremors.  Psychiatric/Behavioral:  The patient is nervous/anxious.      Objective:   BP (!) 148/78   Pulse 80   Temp (!) 97.1 F (36.2 C)   Ht 5' 10.5" (1.791 m)   Wt 233 lb 9.6 oz (106 kg)   SpO2 99%   BMI 33.04 kg/m   Wt Readings from Last 5 Encounters:  09/29/22 233 lb 9.6 oz (106 kg)  07/07/22 233 lb (105.7 kg)  02/13/14 200 lb (90.7 kg)  01/02/14 200 lb (90.7 kg)  12/16/13 200 lb (90.7 kg)     Physical Exam  Last thyroid functions Lab Results  Component Value Date   TSH 0.375 (L) 07/07/2022      Assessment & Plan:   Low TSH level - Thyroid Panel With TSH   Follow up:  Follow up in 3 months    Return in about 3 months (around 12/29/2022).   [name], NP 09/29/2022

## 2022-09-29 NOTE — Patient Instructions (Addendum)
1. Low TSH level  - Thyroid Panel With TSH   Follow up:  Follow up in 3 months

## 2022-09-30 ENCOUNTER — Other Ambulatory Visit: Payer: Self-pay | Admitting: Nurse Practitioner

## 2022-09-30 DIAGNOSIS — E0590 Thyrotoxicosis, unspecified without thyrotoxic crisis or storm: Secondary | ICD-10-CM

## 2022-09-30 LAB — THYROID PANEL WITH TSH
Free Thyroxine Index: 2.6 (ref 1.2–4.9)
T3 Uptake Ratio: 29 % (ref 24–39)
T4, Total: 8.9 ug/dL (ref 4.5–12.0)
TSH: 0.301 u[IU]/mL — ABNORMAL LOW (ref 0.450–4.500)

## 2022-10-06 ENCOUNTER — Ambulatory Visit: Payer: Self-pay | Admitting: Nurse Practitioner

## 2022-10-07 ENCOUNTER — Ambulatory Visit: Payer: Self-pay | Admitting: Nurse Practitioner

## 2022-10-07 ENCOUNTER — Telehealth: Payer: Self-pay

## 2022-10-07 NOTE — Telephone Encounter (Signed)
Requesting lab results from last week. Would like a phone call and for the results to be mailed also.

## 2022-10-15 NOTE — Telephone Encounter (Signed)
Pt was three times and mailed a copy of labs [facility]

## 2022-10-18 NOTE — Telephone Encounter (Signed)
Detailed message left on identifiable machine belonging to the patient. Advised to call with any questions or concerns.

## 2022-12-22 ENCOUNTER — Encounter: Payer: Self-pay | Admitting: Nurse Practitioner

## 2022-12-22 ENCOUNTER — Ambulatory Visit (INDEPENDENT_AMBULATORY_CARE_PROVIDER_SITE_OTHER): Payer: Medicare Other | Admitting: Nurse Practitioner

## 2022-12-22 VITALS — BP 148/90 | HR 88 | Ht 70.5 in | Wt 235.0 lb

## 2022-12-22 DIAGNOSIS — R7989 Other specified abnormal findings of blood chemistry: Secondary | ICD-10-CM

## 2022-12-22 DIAGNOSIS — Z0000 Encounter for general adult medical examination without abnormal findings: Secondary | ICD-10-CM

## 2022-12-22 DIAGNOSIS — E0590 Thyrotoxicosis, unspecified without thyrotoxic crisis or storm: Secondary | ICD-10-CM

## 2022-12-22 LAB — POCT GLYCOSYLATED HEMOGLOBIN (HGB A1C): HbA1c, POC (prediabetic range): 5.7 % (ref 5.7–6.4)

## 2022-12-22 NOTE — Progress Notes (Addendum)
 Subjective   Patient ID: Greg Fry, male    DOB: 1955/10/06, 67 y.o.   MRN: [id number]  Chief Complaint  Patient presents with   Medical Management of Chronic Issues    No concerns or questions.     Referring provider: [name], NP  Greg Fry is a 67 y.o. male with Past Medical History: No date: Dislocated shoulder   HPI  Patient presents today for follow-up visit.  He does need a recheck on his thyroid today.  Blood pressure was borderline in office today but patient does have whitecoat syndrome.  We discussed that A1c level was borderline prediabetic.  We discussed that he does need to follow a stricter diabetic diet and exercise. Will need recheck on thyroid today due to previous abnormal TSH. Denies f/c/s, n/v/d, hemoptysis, PND, leg swelling Denies chest pain or edema      Allergies  Allergen Reactions   Penicillins Other (See Comments)    Child hood reaction     There is no immunization history on file for this patient.  Tobacco History: Social History   Tobacco Use  Smoking Status Former   Types: Cigarettes  Smokeless Tobacco Not on file  Tobacco Comments   Stopped smoking 5 years ago.   Counseling given: No Tobacco comments: Stopped smoking 5 years ago.   Outpatient Encounter Medications as of 12/22/2022  Medication Sig   ibuprofen (ADVIL,MOTRIN) 200 MG tablet Take 400 mg by mouth every 6 (six) hours as needed for headache or moderate pain.    aspirin-sod bicarb-citric acid (ALKA-SELTZER) 325 MG TBEF tablet Take 650 mg by mouth every 6 (six) hours as needed (cold). (Patient not taking: Reported on 12/22/2022)   diphenhydrAMINE (BENADRYL) 25 MG tablet Take 25-50 mg by mouth every 6 (six) hours as needed for allergies. (Patient not taking: Reported on 12/22/2022)   escitalopram (LEXAPRO) 10 MG tablet Take 1 tablet (10 mg total) by mouth daily. (Patient not taking: Reported on 09/29/2022)   No facility-administered encounter medications on file  as of 12/22/2022.    Review of Systems  Review of Systems  Constitutional: Negative.   HENT: Negative.    Cardiovascular: Negative.   Gastrointestinal: Negative.   Allergic/Immunologic: Negative.   Neurological: Negative.   Psychiatric/Behavioral: Negative.       Objective:   BP (!) 148/90   Pulse 88   Ht 5' 10.5" (1.791 m)   Wt 235 lb (106.6 kg)   SpO2 99%   BMI 33.24 kg/m   Wt Readings from Last 5 Encounters:  12/22/22 235 lb (106.6 kg)  09/29/22 233 lb 9.6 oz (106 kg)  07/07/22 233 lb (105.7 kg)  02/13/14 200 lb (90.7 kg)  01/02/14 200 lb (90.7 kg)     Physical Exam Vitals and nursing note reviewed.  Constitutional:      General: He is not in acute distress.    Appearance: He is well-developed.  Cardiovascular:     Rate and Rhythm: Normal rate and regular rhythm.  Pulmonary:     Effort: Pulmonary effort is normal.     Breath sounds: Normal breath sounds.  Skin:    General: Skin is warm and dry.  Neurological:     Mental Status: He is alert and oriented to person, place, and time.       Assessment & Plan:   Abnormal TSH -     Thyroid Panel With TSH -     POCT glycosylated hemoglobin (Hb A1C)  Routine health maintenance -  Comprehensive metabolic panel     Return in about 3 months (around 03/22/2023).     [name], NP 12/22/2022

## 2022-12-22 NOTE — Patient Instructions (Addendum)
1. Abnormal TSH  - Thyroid Panel With TSH   2. Routine health maintenance  - Comprehensive metabolic panel   Follow up:  Follow up in 3 months

## 2022-12-23 LAB — COMPREHENSIVE METABOLIC PANEL
ALT: 25 [IU]/L (ref 0–44)
AST: 22 [IU]/L (ref 0–40)
Albumin: 4.3 g/dL (ref 3.9–4.9)
Alkaline Phosphatase: 200 [IU]/L — ABNORMAL HIGH (ref 44–121)
BUN/Creatinine Ratio: 18 (ref 10–24)
BUN: 16 mg/dL (ref 8–27)
Bilirubin Total: 0.3 mg/dL (ref 0.0–1.2)
CO2: 20 mmol/L (ref 20–29)
Calcium: 9.2 mg/dL (ref 8.6–10.2)
Chloride: 107 mmol/L — ABNORMAL HIGH (ref 96–106)
Creatinine, Ser: 0.89 mg/dL (ref 0.76–1.27)
Globulin, Total: 2.9 g/dL (ref 1.5–4.5)
Glucose: 103 mg/dL — ABNORMAL HIGH (ref 70–99)
Potassium: 4.4 mmol/L (ref 3.5–5.2)
Sodium: 141 mmol/L (ref 134–144)
Total Protein: 7.2 g/dL (ref 6.0–8.5)
eGFR: 95 mL/min/{1.73_m2} (ref >59)

## 2022-12-23 LAB — THYROID PANEL WITH TSH
Free Thyroxine Index: 2.8 (ref 1.2–4.9)
T3 Uptake Ratio: 30 % (ref 24–39)
T4, Total: 9.4 ug/dL (ref 4.5–12.0)
TSH: 0.339 u[IU]/mL — ABNORMAL LOW (ref 0.450–4.500)

## 2023-01-06 ENCOUNTER — Ambulatory Visit: Payer: Self-pay | Admitting: Nurse Practitioner

## 2023-01-09 ENCOUNTER — Telehealth: Payer: Self-pay

## 2023-01-09 NOTE — Telephone Encounter (Signed)
-----   Message from [name] sent at 12/23/2022  8:23 AM EST ----- Labs are stable

## 2023-01-29 ENCOUNTER — Inpatient Hospital Stay (HOSPITAL_COMMUNITY)
Admission: EM | Admit: 2023-01-29 | Discharge: 2023-01-31 | DRG: 066 | Disposition: A | Discharge status: Home / Self Care | Payer: Medicare Other | Attending: Internal Medicine | Admitting: Internal Medicine

## 2023-01-29 ENCOUNTER — Other Ambulatory Visit: Payer: Self-pay

## 2023-01-29 ENCOUNTER — Encounter (HOSPITAL_COMMUNITY): Payer: Self-pay

## 2023-01-29 ENCOUNTER — Emergency Department (HOSPITAL_COMMUNITY): Payer: Medicare Other

## 2023-01-29 DIAGNOSIS — I444 Left anterior fascicular block: Secondary | ICD-10-CM | POA: Diagnosis present

## 2023-01-29 DIAGNOSIS — Z880 Allergy status to penicillin: Secondary | ICD-10-CM

## 2023-01-29 DIAGNOSIS — F1410 Cocaine abuse, uncomplicated: Secondary | ICD-10-CM | POA: Diagnosis present

## 2023-01-29 DIAGNOSIS — R270 Ataxia, unspecified: Secondary | ICD-10-CM | POA: Diagnosis present

## 2023-01-29 DIAGNOSIS — D37030 Neoplasm of uncertain behavior of the parotid salivary glands: Secondary | ICD-10-CM | POA: Diagnosis present

## 2023-01-29 DIAGNOSIS — Z6834 Body mass index (BMI) 34.0-34.9, adult: Secondary | ICD-10-CM | POA: Diagnosis not present

## 2023-01-29 DIAGNOSIS — Z7151 Drug abuse counseling and surveillance of drug abuser: Secondary | ICD-10-CM | POA: Diagnosis not present

## 2023-01-29 DIAGNOSIS — I6350 Cerebral infarction due to unspecified occlusion or stenosis of unspecified cerebral artery: Secondary | ICD-10-CM | POA: Diagnosis not present

## 2023-01-29 DIAGNOSIS — R29810 Facial weakness: Secondary | ICD-10-CM | POA: Diagnosis present

## 2023-01-29 DIAGNOSIS — F1990 Other psychoactive substance use, unspecified, uncomplicated: Secondary | ICD-10-CM | POA: Diagnosis present

## 2023-01-29 DIAGNOSIS — F40240 Claustrophobia: Secondary | ICD-10-CM | POA: Diagnosis present

## 2023-01-29 DIAGNOSIS — I6329 Cerebral infarction due to unspecified occlusion or stenosis of other precerebral arteries: Secondary | ICD-10-CM | POA: Diagnosis present

## 2023-01-29 DIAGNOSIS — F1411 Cocaine abuse, in remission: Secondary | ICD-10-CM | POA: Diagnosis present

## 2023-01-29 DIAGNOSIS — E66811 Obesity, class 1: Secondary | ICD-10-CM | POA: Diagnosis present

## 2023-01-29 DIAGNOSIS — R29701 NIHSS score 1: Secondary | ICD-10-CM | POA: Diagnosis present

## 2023-01-29 DIAGNOSIS — R030 Elevated blood-pressure reading, without diagnosis of hypertension: Secondary | ICD-10-CM | POA: Diagnosis present

## 2023-01-29 DIAGNOSIS — I10 Essential (primary) hypertension: Secondary | ICD-10-CM | POA: Diagnosis present

## 2023-01-29 DIAGNOSIS — I639 Cerebral infarction, unspecified: Secondary | ICD-10-CM | POA: Diagnosis present

## 2023-01-29 DIAGNOSIS — H532 Diplopia: Secondary | ICD-10-CM

## 2023-01-29 DIAGNOSIS — Z87891 Personal history of nicotine dependence: Secondary | ICD-10-CM | POA: Diagnosis not present

## 2023-01-29 DIAGNOSIS — I6389 Other cerebral infarction: Secondary | ICD-10-CM | POA: Diagnosis not present

## 2023-01-29 DIAGNOSIS — R7989 Other specified abnormal findings of blood chemistry: Secondary | ICD-10-CM | POA: Diagnosis present

## 2023-01-29 DIAGNOSIS — H5500 Unspecified nystagmus: Secondary | ICD-10-CM | POA: Diagnosis present

## 2023-01-29 LAB — CBC
HCT: 47.8 % (ref 39.0–52.0)
Hemoglobin: 16.3 g/dL (ref 13.0–17.0)
MCH: 31.1 pg (ref 26.0–34.0)
MCHC: 34.1 g/dL (ref 30.0–36.0)
MCV: 91.2 fL (ref 80.0–100.0)
Platelets: 247 10*3/uL (ref 150–400)
RBC: 5.24 MIL/uL (ref 4.22–5.81)
RDW: 11.9 % (ref 11.5–15.5)
WBC: 8.9 10*3/uL (ref 4.0–10.5)
nRBC: 0 % (ref 0.0–0.2)

## 2023-01-29 LAB — URINALYSIS, ROUTINE W REFLEX MICROSCOPIC
Bilirubin Urine: NEGATIVE
Glucose, UA: NEGATIVE mg/dL
Hgb urine dipstick: NEGATIVE
Ketones, ur: NEGATIVE mg/dL
Leukocytes,Ua: NEGATIVE
Nitrite: NEGATIVE
Protein, ur: NEGATIVE mg/dL
Specific Gravity, Urine: 1.006 (ref 1.005–1.030)
pH: 6 (ref 5.0–8.0)

## 2023-01-29 LAB — COMPREHENSIVE METABOLIC PANEL
ALT: 32 U/L (ref 0–44)
AST: 20 U/L (ref 15–41)
Albumin: 3.7 g/dL (ref 3.5–5.0)
Alkaline Phosphatase: 149 U/L — ABNORMAL HIGH (ref 38–126)
Anion gap: 8 (ref 5–15)
BUN: 11 mg/dL (ref 8–23)
CO2: 21 mmol/L — ABNORMAL LOW (ref 22–32)
Calcium: 9 mg/dL (ref 8.9–10.3)
Chloride: 105 mmol/L (ref 98–111)
Creatinine, Ser: 0.84 mg/dL (ref 0.61–1.24)
GFR, Estimated: >60 mL/min (ref >60)
Glucose, Bld: 117 mg/dL — ABNORMAL HIGH (ref 70–99)
Potassium: 4 mmol/L (ref 3.5–5.1)
Sodium: 134 mmol/L — ABNORMAL LOW (ref 135–145)
Total Bilirubin: 0.8 mg/dL (ref 0.0–1.2)
Total Protein: 7.3 g/dL (ref 6.5–8.1)

## 2023-01-29 LAB — RAPID URINE DRUG SCREEN, HOSP PERFORMED
Amphetamines: NOT DETECTED
Barbiturates: NOT DETECTED
Benzodiazepines: POSITIVE — AB
Cocaine: POSITIVE — AB
Opiates: NOT DETECTED
Tetrahydrocannabinol: NOT DETECTED

## 2023-01-29 LAB — I-STAT CHEM 8, ED
BUN: 10 mg/dL (ref 8–23)
Calcium, Ion: 1.2 mmol/L (ref 1.15–1.40)
Chloride: 105 mmol/L (ref 98–111)
Creatinine, Ser: 0.9 mg/dL (ref 0.61–1.24)
Glucose, Bld: 116 mg/dL — ABNORMAL HIGH (ref 70–99)
HCT: 50 % (ref 39.0–52.0)
Hemoglobin: 17 g/dL (ref 13.0–17.0)
Potassium: 4.1 mmol/L (ref 3.5–5.1)
Sodium: 139 mmol/L (ref 135–145)
TCO2: 21 mmol/L — ABNORMAL LOW (ref 22–32)

## 2023-01-29 LAB — DIFFERENTIAL
Abs Immature Granulocytes: 0.02 10*3/uL (ref 0.00–0.07)
Basophils Absolute: 0.1 10*3/uL (ref 0.0–0.1)
Basophils Relative: 1 %
Eosinophils Absolute: 0.2 10*3/uL (ref 0.0–0.5)
Eosinophils Relative: 2 %
Immature Granulocytes: 0 %
Lymphocytes Relative: 19 %
Lymphs Abs: 1.7 10*3/uL (ref 0.7–4.0)
Monocytes Absolute: 0.8 10*3/uL (ref 0.1–1.0)
Monocytes Relative: 9 %
Neutro Abs: 6.2 10*3/uL (ref 1.7–7.7)
Neutrophils Relative %: 69 %

## 2023-01-29 LAB — ETHANOL: Alcohol, Ethyl (B): <10 mg/dL (ref <10)

## 2023-01-29 LAB — LIPID PANEL
Cholesterol: 150 mg/dL (ref 0–200)
HDL: 39 mg/dL — ABNORMAL LOW (ref >40)
LDL Cholesterol: 79 mg/dL (ref 0–99)
Total CHOL/HDL Ratio: 3.8 {ratio}
Triglycerides: 162 mg/dL — ABNORMAL HIGH (ref <150)
VLDL: 32 mg/dL (ref 0–40)

## 2023-01-29 LAB — CBG MONITORING, ED: Glucose-Capillary: 137 mg/dL — ABNORMAL HIGH (ref 70–99)

## 2023-01-29 MED ORDER — ACETAMINOPHEN 325 MG PO TABS: 650.0000 mg | ORAL_TABLET | ORAL | Status: DC | PRN

## 2023-01-29 MED ORDER — ACETAMINOPHEN 650 MG RE SUPP: 650.0000 mg | RECTAL | Status: DC | PRN

## 2023-01-29 MED ORDER — HALOPERIDOL LACTATE 5 MG/ML IJ SOLN
5.0000 mg | Freq: Once | INTRAMUSCULAR | Status: AC
  Administered 2023-01-29: 5 mg via INTRAVENOUS
  Filled 2023-01-29: qty 1

## 2023-01-29 MED ORDER — ASPIRIN 325 MG PO TABS: 325.0000 mg | ORAL_TABLET | Freq: Every day | ORAL | Status: DC

## 2023-01-29 MED ORDER — STROKE: EARLY STAGES OF RECOVERY BOOK
Freq: Once | Status: AC
  Filled 2023-01-29: qty 1

## 2023-01-29 MED ORDER — PANTOPRAZOLE SODIUM 40 MG IV SOLR
40.0000 mg | INTRAVENOUS | Status: DC
  Administered 2023-01-29 – 2023-01-30 (×2): 40 mg via INTRAVENOUS
  Filled 2023-01-29 (×2): qty 10

## 2023-01-29 MED ORDER — THIAMINE HCL 100 MG/ML IJ SOLN
INTRAMUSCULAR | Status: AC
Start: 2023-01-29 — End: ?
  Filled 2023-01-29: qty 6

## 2023-01-29 MED ORDER — HEPARIN SODIUM (PORCINE) 5000 UNIT/ML IJ SOLN
5000.0000 [IU] | Freq: Three times a day (TID) | INTRAMUSCULAR | Status: DC
  Administered 2023-01-29 – 2023-01-31 (×6): 5000 [IU] via SUBCUTANEOUS
  Filled 2023-01-29 (×6): qty 1

## 2023-01-29 MED ORDER — ACETAMINOPHEN 160 MG/5ML PO SOLN: 650.0000 mg | ORAL | Status: DC | PRN

## 2023-01-29 MED ORDER — SODIUM CHLORIDE 0.9 % IV SOLN: INTRAVENOUS | Status: DC

## 2023-01-29 MED ORDER — HYDRALAZINE HCL 20 MG/ML IJ SOLN: 10.0000 mg | Freq: Three times a day (TID) | INTRAMUSCULAR | Status: DC | PRN

## 2023-01-29 MED ORDER — THIAMINE HCL 100 MG/ML IJ SOLN
500.0000 mg | Freq: Once | INTRAVENOUS | Status: AC
  Administered 2023-01-30: 500 mg via INTRAVENOUS
  Filled 2023-01-29: qty 5

## 2023-01-29 MED ORDER — ATORVASTATIN CALCIUM 40 MG PO TABS
40.0000 mg | ORAL_TABLET | Freq: Every day | ORAL | Status: DC
  Administered 2023-01-30 – 2023-01-31 (×2): 40 mg via ORAL
  Filled 2023-01-29 (×2): qty 1

## 2023-01-29 MED ORDER — DIAZEPAM 5 MG/ML IJ SOLN
5.0000 mg | Freq: Once | INTRAMUSCULAR | Status: AC
  Administered 2023-01-29: 5 mg via INTRAVENOUS
  Filled 2023-01-29: qty 2

## 2023-01-29 MED ORDER — ASPIRIN 81 MG PO TBEC
81.0000 mg | DELAYED_RELEASE_TABLET | Freq: Every day | ORAL | Status: DC
  Administered 2023-01-30 – 2023-01-31 (×2): 81 mg via ORAL
  Filled 2023-01-29 (×2): qty 1

## 2023-01-29 NOTE — Plan of Care (Signed)
   Problem: Education: Goal: Knowledge of disease or condition will improve Outcome: Progressing Goal: Knowledge of secondary prevention will improve (MUST DOCUMENT ALL) Outcome: Progressing Goal: Knowledge of patient specific risk factors will improve Greg Fry N/A or DELETE if not current risk factor) Outcome: Progressing   Problem: Ischemic Stroke/TIA Tissue Perfusion: Goal: Complications of ischemic stroke/TIA will be minimized Outcome: Progressing   Problem: Coping: Goal: Will verbalize positive feelings about self Outcome: Progressing Goal: Will identify appropriate support needs Outcome: Progressing   Problem: Health Behavior/Discharge Planning: Goal: Ability to manage health-related needs will improve Outcome: Progressing Goal: Goals will be collaboratively established with patient/family Outcome: Progressing   Problem: Self-Care: Goal: Ability to participate in self-care as condition permits will improve Outcome: Progressing Goal: Verbalization of feelings and concerns over difficulty with self-care will improve Outcome: Progressing Goal: Ability to communicate needs accurately will improve Outcome: Progressing   Problem: Nutrition: Goal: Risk of aspiration will decrease Outcome: Progressing Goal: Dietary intake will improve Outcome: Progressing   Problem: Education: Goal: Knowledge of General Education information will improve Description: Including pain rating scale, medication(s)/side effects and non-pharmacologic comfort measures Outcome: Progressing   Problem: Health Behavior/Discharge Planning: Goal: Ability to manage health-related needs will improve Outcome: Progressing

## 2023-01-29 NOTE — Hospital Course (Signed)
 Dr.Lindzen.

## 2023-01-29 NOTE — Assessment & Plan Note (Addendum)
 MRI of the head shows Punctate focus of restricted diffusion in the right paramedian posterior pons compatible with a punctate acute infarct. This may involve the pathway of the fourth cranial nerve or the sixth nerve  nucleus corresponding to the abnormal eye movements. Will continue with  aspirin  81 and statin therapy along with evaluation for thyroid  diabetes lipids B12. PT/OT and speech.

## 2023-01-29 NOTE — ED Triage Notes (Signed)
 Pt BIB family for possible stroke. Pt states symptoms started yesterday morning when awaking. EDP in triage at this time.

## 2023-01-29 NOTE — ED Provider Notes (Signed)
 [facility] EMERGENCY DEPARTMENT AT [facility] Provider Note   CSN: [id number] Arrival date & time: 01/29/23  8367     History  Chief Complaint  Patient presents with   Dizziness    Greg Fry is a 68 y.o. male who presents emergency department for evaluation of double vision and ataxia.  He is a 68 year old male who does not see doctors.  He is a known cigarette smoker.  He has a history of high blood pressure per sister that is untreated.  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  She states that she has noticed abnormality in his left eye.  He states that he woke up yesterday morning in his normal state of health, ate breakfast when back to bed and woke up about midmorning with double vision and ataxia.  This is new.  He did not tell anybody until today when his sister saw him she told him he needed to go to the emergency department immediately.  She reports that his right eye appears to be in his normal state but she has noticed that his left eye appears to be moving around abnormally.  He also appears to have some facial asymmetry.  She states that his baseline   Dizziness      Home Medications Prior to Admission medications   Medication Sig Start Date End Date Taking? Authorizing Provider  aspirin -sod bicarb-citric acid (ALKA-SELTZER) 325 MG TBEF tablet Take 650 mg by mouth every 6 (six) hours as needed (cold). Patient not taking: Reported on 12/22/2022    [provider]  diphenhydrAMINE (BENADRYL) 25 MG tablet Take 25-50 mg by mouth every 6 (six) hours as needed for allergies. Patient not taking: Reported on 12/22/2022    [provider]  escitalopram  (LEXAPRO ) 10 MG tablet Take 1 tablet (10 mg total) by mouth daily. Patient not taking: Reported on 09/29/2022 07/07/22 08/06/22  Nichols, Tonya S, NP  ibuprofen (ADVIL,MOTRIN) 200 MG tablet Take 400 mg by mouth every 6 (six) hours as needed for headache or moderate  pain.     [provider]      Allergies    Penicillins    Review of Systems   Review of Systems  Neurological:  Positive for dizziness.    Physical Exam Updated Vital Signs BP (!) 155/90 (BP Location: Right Arm)   Pulse 97   Temp 98.2 F (36.8 C)   Resp 18   Ht 5' 10.5 (1.791 m)   Wt 104.3 kg   SpO2 92%   BMI 32.54 kg/m  Physical Exam HENT:     Head: Normocephalic and atraumatic.     Mouth/Throat:     Mouth: Mucous membranes are moist.  Eyes:     Comments: Patient with disconjugate gaze.  The right eye is everted.  He is unable to bring the right eye past midline towards the nasal side.  The left eye also has significant nystagmus.  He has a positive test of skew.   Visual fields are intact however patient has binocular double vision  Cardiovascular:     Rate and Rhythm: Normal rate.  Pulmonary:     Effort: Pulmonary effort is normal.     Breath sounds: Normal breath sounds.  Musculoskeletal:        General: Normal range of motion.     Cervical back: Normal range of motion and neck supple.  Neurological:     Mental Status: He is alert.  Cranial Nerves: Cranial nerve deficit present.     Sensory: No sensory deficit.     Motor: No weakness.     Gait: Gait abnormal.     Deep Tendon Reflexes: Reflexes normal.     Comments: Left-sided facial asymmetry appears to be baseline for the patient Moves extremities without ataxia Balance is poor with ambulation Strength and sensation in all 4 extremities     ED Results / Procedures / Treatments   Labs (all labs ordered are listed, but only abnormal results are displayed) Labs Reviewed  CBG MONITORING, ED - Abnormal; Notable for the following components:      Result Value   Glucose-Capillary 137 (*)    All other components within normal limits  ETHANOL  CBC  DIFFERENTIAL  COMPREHENSIVE METABOLIC PANEL  RAPID URINE DRUG SCREEN, HOSP PERFORMED  URINALYSIS, ROUTINE W REFLEX MICROSCOPIC  I-STAT CHEM 8,  ED    EKG None  Radiology No results found.  Procedures .Critical Care  Performed by: Arloa Chroman, PA-C Authorized by: Arloa Chroman, PA-C   Critical care provider statement:    Critical care time (minutes):  30   Critical care time was exclusive of:  Separately billable procedures and treating other patients   Critical care was necessary to treat or prevent imminent or life-threatening deterioration of the following conditions:  CNS failure or compromise   Critical care was time spent personally by me on the following activities:  Development of treatment plan with patient or surrogate, discussions with consultants, evaluation of patient's response to treatment, examination of patient, ordering and review of laboratory studies, ordering and review of radiographic studies, ordering and performing treatments and interventions, pulse oximetry, re-evaluation of patient's condition, review of old charts and obtaining history from patient or surrogate     Medications Ordered in ED Medications  haloperidol  lactate (HALDOL ) injection 5 mg (has no administration in time range)  diazepam  (VALIUM ) injection 5 mg (has no administration in time range)    ED Course/ Medical Decision Making/ A&P Clinical Course as of 01/31/23 1556  Sun Jan 29, 2023  1718 Patient Refusing MRI- I have ordered calming medications. Will attempt to obtain the mri  [AH]  [address]-patient will need admission for acute stroke workup. [AH]    Clinical Course User Index [AH] Arloa Chroman, PA-C                                  Medical Decision Making This patient presents to the ED for concern of acute vision, this involves an extensive number of treatment options, and is a complaint that carries with it a high risk of complications and morbidity. .  Co morbidities:       has a past medical history of Dislocated shoulder.   Social Determinants of Health:       polysubstance use  Does not got  to doctors  Additional history:  {Additional history obtained from sister {External records from outside source obtained and reviewed including previous er notes  Lab Tests:  I Ordered, and personally interpreted labs.  The pertinent results include:    UDS pos, otherwise no acute findings   Imaging Studies:  I ordered imaging studies including MRI I independently visualized and interpreted imaging which showed acute pontine stroke I agree with the radiologist interpretation  Cardiac Monitoring/ECG:       The patient was maintained on a cardiac monitor.  I personally  viewed and interpreted the cardiac monitored which showed an underlying rhythm of: nsr  Medicines ordered and prescription drug management:   haloperidol  lactate (HALDOL ) injection 5 mg (5 mg Intravenous Given 01/29/23 1734) diazepam  (VALIUM ) injection 5 mg (5 mg Intravenous Given 01/29/23 1735) for anxiolysis Reevaluation of the patient after these medicines showed that the patient improved I have reviewed the patients home medicines and have made adjustments as needed  Test Considered:       CTA h/nk  Critical Interventions:         Consultations Obtained: Dr. Merrianne with neurology, Dr. 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  Will need admission for further evaluation and workup.   Dispostion:  After consideration of the diagnostic results and the patients response to treatment, I feel that the patent would benefit from admit.    Amount and/or Complexity of Data Reviewed Labs: ordered. Radiology: ordered.  Risk Prescription drug management. Decision regarding  hospitalization.           Final Clinical Impression(s) / ED Diagnoses Final diagnoses:  None     Rx / DC Orders ED Discharge Orders     None         Arloa Chroman, PA-C 01/31/23 1613    Cleotilde Rogue, MD 01/31/23 2003

## 2023-01-29 NOTE — H&P (Signed)
 History and Physical    Patient: Greg Fry FMW:[id number] DOB: Aug 19, 1955 DOA: 01/29/2023 DOS: the patient was seen and examined on 01/29/2023 PCP: Oley Bascom RAMAN, NP  Patient coming from: Home Chief complaint: Chief Complaint  Patient presents with   Dizziness   HPI:  Greg Fry is a 68 y.o. male with past medical history  of cocaine use, allergies to penicillin, ? Lazy eye, elevated blood pressures-question whitecoat syndrome, history of tobacco abuse, presenting today with double vision that he noticed today morning and resolved with closing either of the eyes.  He could not walk because he felt off balance.  Mild facial droop per report patient took an aspirin  yesterday he does not take aspirin  at baseline.  Today morning when his sister saw him she told him he needed to go to the hospital immediately.     >>ED Course: In emergency room patient is alert awake oriented with grossly abnormal cranial nerve findings on exam seen with abnormal extraocular movements and a facial droop on the left side.  Vitals since arrival show patient is afebrile blood pressure stable over 140s O2 sats within normal limits and respirations of 22. Vitals:   01/29/23 2115 01/29/23 2130 01/29/23 2150 01/29/23 2200  BP: (!) 141/88 (!) 148/89  (!) 167/77  Pulse: 73 78 68 65  Temp:   98.4 F (36.9 C)   Resp: 19 (!) 22 13 16   Height:      Weight:      SpO2: 94% 94% 97% 94%  BMI (Calculated):      Past EKG today shows sinus rhythm at 94, PVCs, PR interval of 157, QTc of 427. Evaluation so far shows: CMP shows alk phos of 149 normal LFTs glucose 117 normal kidney function and normal electrolytes except for mild hyponatremia of 134 and bicarb of 21. CBC is within normal limits. UDS pending.SABRA MRI shows abnormal findings of Punctate focus in the right posterior pons affecting the 4th cranial nerve 4th or 6th cranial nerve producing these abnormal eye movements and nystagmus.  EDMD discussed case with  neurology on-call who recommended admission with TIA order set. Requested ED MD to start patient on aspirin  324 single dose in the ED if he can swallow.  In the ED pt received aspirin  and Haldol  along with Valium . Medications  aspirin  tablet 325 mg (325 mg Oral Not Given 01/29/23 1949)  haloperidol  lactate (HALDOL ) injection 5 mg (5 mg Intravenous Given 01/29/23 1734)  diazepam  (VALIUM ) injection 5 mg (5 mg Intravenous Given 01/29/23 1735)   Review of Systems  Eyes:  Positive for double vision.  Neurological:  Positive for dizziness.  All other systems reviewed and are negative.  Past Medical History:  Diagnosis Date   Dislocated shoulder    Past Surgical History:  Procedure Laterality Date   left shoulder dislocation Left    SHOULDER CLOSED REDUCTION Left 06/28/2013   Procedure: CLOSED REDUCTION LEFT SHOULDER UNDER ANESTHESIA;  Surgeon: Taft FORBES Minerva, MD;  Location: [facility];  Service: Orthopedics;  Laterality: Left;    reports that he has quit smoking. His smoking use included cigarettes. He does not have any smokeless tobacco history on file. He reports current alcohol use. He reports that he does not currently use drugs after having used the following drugs: Cocaine and Marijuana.  Allergies  Allergen Reactions   Penicillins Other (See Comments)    Child hood reaction    History reviewed. No pertinent family history.  Prior to Admission medications   Medication  Sig Start Date End Date Taking? Authorizing Provider  aspirin  EC 81 MG tablet Take 81 mg by mouth every 6 (six) hours as needed (high blood pressure). Swallow whole.   Yes [provider]   Vitals:   01/29/23 2115 01/29/23 2130 01/29/23 2150 01/29/23 2200  BP: (!) 141/88 (!) 148/89  (!) 167/77  Pulse: 73 78 68 65  Resp: 19 (!) 22 13 16   Temp:   98.4 F (36.9 C)   SpO2: 94% 94% 97% 94%  Weight:      Height:       Physical Exam Vitals and nursing note reviewed.  Constitutional:      General: He is  not in acute distress. HENT:     Head: Normocephalic and atraumatic.     Right Ear: Hearing and external ear normal.     Left Ear: Hearing and external ear normal.     Nose: Nose normal. No nasal deformity.     Mouth/Throat:     Lips: Pink.     Mouth: Mucous membranes are dry.     Tongue: No lesions.     Pharynx: Oropharynx is clear.  Eyes:     General: Lids are normal. Vision grossly intact.     Extraocular Movements:     Right eye: Abnormal extraocular motion present.     Left eye: Abnormal extraocular motion and nystagmus present.     Pupils: Pupils are equal, round, and reactive to light. Pupils are equal.     Right eye: Pupil is reactive.     Left eye: Pupil is reactive.     Comments: Pt has horizontal nystagmus in left eye , while looking to left. He keeps left eye closed as he gets diplopia and dizziness.    Neck:     Vascular: No carotid bruit.  Cardiovascular:     Rate and Rhythm: Normal rate and regular rhythm.     Pulses: Normal pulses.     Heart sounds: Normal heart sounds. No murmur heard. Pulmonary:     Effort: Pulmonary effort is normal.     Breath sounds: Normal breath sounds.  Abdominal:     General: Bowel sounds are normal. There is no distension.     Palpations: Abdomen is soft. There is no mass.     Tenderness: There is no abdominal tenderness.  Musculoskeletal:     Cervical back: No tenderness.     Right lower leg: No edema.     Left lower leg: No edema.  Skin:    General: Skin is warm.  Neurological:     General: No focal deficit present.     Mental Status: He is alert and oriented to person, place, and time.     Cranial Nerves: Cranial nerves 2-12 are intact. No cranial nerve deficit, dysarthria or facial asymmetry.     Sensory: Sensation is intact.     Motor: Motor function is intact. No weakness, tremor or abnormal muscle tone.     Coordination: Coordination normal. Finger-Nose-Finger Test and Heel to [facility] Test normal.     Deep Tendon Reflexes:      Reflex Scores:      Bicep reflexes are 1+ on the right side and 1+ on the left side.      Patellar reflexes are 1+ on the right side and 1+ on the left side. Psychiatric:        Attention and Perception: Attention normal.        Mood and Affect: Mood  normal.        Speech: Speech normal.        Behavior: Behavior normal. Behavior is cooperative.        Thought Content: Thought content normal.   Labs on Admission: I have personally reviewed following labs and imaging studies Results for orders placed or performed during the hospital encounter of 01/29/23 (from the past 24 hours)  CBG monitoring, ED     Status: Abnormal   Collection Time: 01/29/23  4:39 PM  Result Value Ref Range   Glucose-Capillary 137 (H) 70 - 99 mg/dL  Urine rapid drug screen (hosp performed)     Status: Abnormal   Collection Time: 01/29/23  4:43 PM  Result Value Ref Range   Opiates NONE DETECTED NONE DETECTED   Cocaine POSITIVE (A) NONE DETECTED   Benzodiazepines POSITIVE (A) NONE DETECTED   Amphetamines NONE DETECTED NONE DETECTED   Tetrahydrocannabinol NONE DETECTED NONE DETECTED   Barbiturates NONE DETECTED NONE DETECTED  Urinalysis, Routine w reflex microscopic -Urine, Clean Catch     Status: None   Collection Time: 01/29/23  4:43 PM  Result Value Ref Range   Color, Urine YELLOW YELLOW   APPearance CLEAR CLEAR   Specific Gravity, Urine 1.006 1.005 - 1.030   pH 6.0 5.0 - 8.0   Glucose, UA NEGATIVE NEGATIVE mg/dL   Hgb urine dipstick NEGATIVE NEGATIVE   Bilirubin Urine NEGATIVE NEGATIVE   Ketones, ur NEGATIVE NEGATIVE mg/dL   Protein, ur NEGATIVE NEGATIVE mg/dL   Nitrite NEGATIVE NEGATIVE   Leukocytes,Ua NEGATIVE NEGATIVE  Ethanol     Status: None   Collection Time: 01/29/23  4:59 PM  Result Value Ref Range   Alcohol, Ethyl (B) <10 <10 mg/dL  CBC     Status: None   Collection Time: 01/29/23  4:59 PM  Result Value Ref Range   WBC 8.9 4.0 - 10.5 K/uL   RBC 5.24 4.22 - 5.81 MIL/uL   Hemoglobin  16.3 13.0 - 17.0 g/dL   HCT 52.1 60.9 - 47.9 %   MCV 91.2 80.0 - 100.0 fL   MCH 31.1 26.0 - 34.0 pg   MCHC 34.1 30.0 - 36.0 g/dL   RDW 88.0 88.4 - 84.4 %   Platelets 247 150 - 400 K/uL   nRBC 0.0 0.0 - 0.2 %  Differential     Status: None   Collection Time: 01/29/23  4:59 PM  Result Value Ref Range   Neutrophils Relative % 69 %   Neutro Abs 6.2 1.7 - 7.7 K/uL   Lymphocytes Relative 19 %   Lymphs Abs 1.7 0.7 - 4.0 K/uL   Monocytes Relative 9 %   Monocytes Absolute 0.8 0.1 - 1.0 K/uL   Eosinophils Relative 2 %   Eosinophils Absolute 0.2 0.0 - 0.5 K/uL   Basophils Relative 1 %   Basophils Absolute 0.1 0.0 - 0.1 K/uL   Immature Granulocytes 0 %   Abs Immature Granulocytes 0.02 0.00 - 0.07 K/uL  Comprehensive metabolic panel     Status: Abnormal   Collection Time: 01/29/23  4:59 PM  Result Value Ref Range   Sodium 134 (L) 135 - 145 mmol/L   Potassium 4.0 3.5 - 5.1 mmol/L   Chloride 105 98 - 111 mmol/L   CO2 21 (L) 22 - 32 mmol/L   Glucose, Bld 117 (H) 70 - 99 mg/dL   BUN 11 8 - 23 mg/dL   Creatinine, Ser 9.15 0.61 - 1.24 mg/dL   Calcium  9.0 8.9 -  10.3 mg/dL   Total Protein 7.3 6.5 - 8.1 g/dL   Albumin 3.7 3.5 - 5.0 g/dL   AST 20 15 - 41 U/L   ALT 32 0 - 44 U/L   Alkaline Phosphatase 149 (H) 38 - 126 U/L   Total Bilirubin 0.8 0.0 - 1.2 mg/dL   GFR, Estimated >39 >39 mL/min   Anion gap 8 5 - 15  Lipid panel     Status: Abnormal   Collection Time: 01/29/23  4:59 PM  Result Value Ref Range   Cholesterol 150 0 - 200 mg/dL   Triglycerides 837 (H) <150 mg/dL   HDL 39 (L) >59 mg/dL   Total CHOL/HDL Ratio 3.8 RATIO   VLDL 32 0 - 40 mg/dL   LDL Cholesterol 79 0 - 99 mg/dL  I-stat chem 8, ED     Status: Abnormal   Collection Time: 01/29/23  5:36 PM  Result Value Ref Range   Sodium 139 135 - 145 mmol/L   Potassium 4.1 3.5 - 5.1 mmol/L   Chloride 105 98 - 111 mmol/L   BUN 10 8 - 23 mg/dL   Creatinine, Ser 9.09 0.61 - 1.24 mg/dL   Glucose, Bld 883 (H) 70 - 99 mg/dL   Calcium ,  Ion 1.20 1.15 - 1.40 mmol/L   TCO2 21 (L) 22 - 32 mmol/L   Hemoglobin 17.0 13.0 - 17.0 g/dL   HCT 49.9 60.9 - 47.9 %   No results found for this or any previous visit (from the past 720 hours). CBC:    Latest Ref Rng & Units 01/29/2023    5:36 PM 01/29/2023    4:59 PM 07/07/2022    3:43 PM  CBC  WBC 4.0 - 10.5 K/uL  8.9  9.0   Hemoglobin 13.0 - 17.0 g/dL 82.9  83.6  83.6   Hematocrit 39.0 - 52.0 % 50.0  47.8  48.0   Platelets 150 - 400 K/uL  247  237    Basic Metabolic Panel: Recent Labs  Lab 01/29/23 1659 01/29/23 1736  NA 134* 139  K 4.0 4.1  CL 105 105  CO2 21*  --   GLUCOSE 117* 116*  BUN 11 10  CREATININE 0.84 0.90  CALCIUM  9.0  --    Creatinine: Lab Results  Component Value Date   CREATININE 0.90 01/29/2023   CREATININE 0.84 01/29/2023   CREATININE 0.89 12/22/2022   Liver Function Tests:    Latest Ref Rng & Units 01/29/2023    4:59 PM 12/22/2022    2:03 PM 07/07/2022    3:43 PM  Hepatic Function  Total Protein 6.5 - 8.1 g/dL 7.3  7.2  7.0   Albumin 3.5 - 5.0 g/dL 3.7  4.3  4.2   AST 15 - 41 U/L 20  22  16    ALT 0 - 44 U/L 32  25  20   Alk Phosphatase 38 - 126 U/L 149  200  188   Total Bilirubin 0.0 - 1.2 mg/dL 0.8  0.3  0.4    Radiological Exams on Admission: MR BRAIN WO CONTRAST Result Date: 01/29/2023 CLINICAL DATA:  Blurred vision and dizziness beginning yesterday. The examination had to be discontinued prior to completion due to severe claustrophobia. EXAM: MRI HEAD WITHOUT CONTRAST TECHNIQUE: Multiplanar, multiecho pulse sequences of the brain and surrounding structures were obtained without intravenous contrast. COMPARISON:  CT head and cervical spine without contrast 12/16/2013. FINDINGS: Brain: Diffusion-weighted images demonstrate a punctate focus of restricted diffusion in the right  paramedian posterior pons. No other restricted diffusion is present. A heterogeneous lesion in the left parotid gland measures 15 mm. A left parotid lesion was present the  cervical spine CT scan of 2015 in retrospect. Other focal lesions are present. IMPRESSION: 1. Punctate focus of restricted diffusion in the right paramedian posterior pons compatible with a punctate acute infarct. This may involve the pathway of the fourth cranial nerve or the sixth nerve nucleus corresponding to the abnormal eye movements. 2. 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 A left parotid lesion was present the cervical spine CT scan of 2015 in retrospect. Recommend non emergent CT of the neck with contrast and ENT referral. These results were called by telephone at the time of interpretation on 01/29/2023 at 6:24 pm to provider Dr. Redell Pinal, who verbally acknowledged these results. Electronically Signed   By: Lonni Necessary M.D.   On: 01/29/2023 18:24    Data Reviewed: Relevant notes from primary care and specialist visits, past discharge summaries as available in EHR, including Care Everywhere. Prior diagnostic testing as pertinent to current admission diagnoses, Updated medications and problem lists for reconciliation ED course, including vitals, labs, imaging, treatment and response to treatment,Triage notes, nursing and pharmacy notes and ED provider's notes Notable results as noted in HPI.Discussed case with EDMD/ ED APP/ or Specialty MD on call and as needed.  >>Assessment and Plan: * Nystagmus 2/2 to acute stroke , although the infarct is on right side and left eye has nystagmus not ipsilateral to what I would expect. We will get MRA head and neck with contrast.  Eye patch and ophthalmology consult if unresolved.    .    Elevated blood pressure reading without diagnosis of hypertension Vitals:   01/29/23 1646 01/29/23 1900 01/29/23 1945 01/29/23 2000  BP: (!) 155/90 (!) 168/87 (!) 143/90 (!) 150/87   01/29/23 2030 01/29/23 2100 01/29/23 2115 01/29/23 2130  BP: (!) 160/90 (!) 154/92 (!) 141/88 (!) 148/89   01/29/23 2200  BP: (!) 167/77  Permissive HTN  tonight.  Prn hydralazine  with SBP 220 and above.   History of cocaine abuse (HCC) UDS is pending. Pt denies using any substance or alcohol.   Acute cerebral infarction [facility]) MRI of the head shows Punctate focus of restricted diffusion in the right paramedian posterior pons compatible with a punctate acute infarct. This may involve the pathway of the fourth cranial nerve or the sixth nerve  nucleus corresponding to the abnormal eye movements. Will continue with  aspirin  81 and statin therapy along with evaluation for thyroid  diabetes lipids B12. PT/OT and speech.   Low TSH level Free T4 and TSH.   DVT prophylaxis:  Heparin   Consults:  Neurology  Advance Care Planning:    Code Status: Full Code   Family Communication:  Wife . Disposition Plan:  Home  Severity of Illness: The appropriate patient status for this patient is INPATIENT. Inpatient status is judged to be reasonable and necessary in order to provide the required intensity of service to ensure the patient's safety. The patient's presenting symptoms, physical exam findings, and initial radiographic and laboratory data in the context of their chronic comorbidities is felt to place them at high risk for further clinical deterioration. Furthermore, it is not anticipated that the patient will be medically stable for discharge from the hospital within 2 midnights of admission.   * I certify that at the point of admission it is my clinical judgment that the patient will require inpatient  hospital care spanning beyond 2 midnights from the point of admission due to high intensity of service, high risk for further deterioration and high frequency of surveillance required.*  Author: Mario LULLA Blanch, MD 01/29/2023 10:39 PM  For on call review [email or web address].   Unresulted Labs (From admission, onward)     Start     Ordered   01/29/23 2225  Vitamin B12  Add-on,   AD        01/29/23 2224   01/29/23 2224  T4, free  Add-on,   AD         01/29/23 2223   01/29/23 2224  TSH  Add-on,   AD        01/29/23 2223   01/29/23 2017  HIV Antibody (routine testing w rflx)  (HIV Antibody (Routine testing w reflex) panel)  Once,   R        01/29/23 2031           Orders Placed This Encounter  Procedures   MR BRAIN WO CONTRAST   MR ANGIO HEAD WO CONTRAST   MR ANGIO NECK W CONTRAST   Ethanol   CBC   Differential   Comprehensive metabolic panel   Urine rapid drug screen (hosp performed)   Urinalysis, Routine w reflex microscopic -Urine, Clean Catch   HIV Antibody (routine testing w rflx)   Lipid panel   T4, free   TSH   Vitamin B12   Diet NPO time specified   Vital signs   ED Cardiac monitoring   Swallow screen   Initiate Carrier Fluid Protocol   If O2 sat If O2 Sat < 94%, administer O2 at 2 liters/minute via nasal cannula.   NIH stroke score   Swallow screen   NIHSS score documentation NIHSS score range: 0-42   Vital signs   Notify physician (specify)   OOB with assistance   Swallow screen - If patient does NOT pass this screen, place order for SLP eval and treat (SLP2) - swallowing evaluation (BSE, MBS and/or diet order as indicated)   NIH stroke scale   Intake and output   Cardiac Monitoring Continuous x 24 hours Indications for use: Acute neurological event   Apply Stroke Care Plan: Ischemic Stroke, TIA   Discuss with patient and document patient's goals for stroke risk factor reduction   Initiate Oral Care Protocol   Initiate Carrier Fluid Protocol   Provide stroke education material to patient and family.   Nurse to provide smoking / tobacco cessation education   If the patient has passed the Stroke Swallow Screen or has a feeding tube, then RN may order General Admission PRN Orders (through manage orders) for the following patient needs: allergy symptoms (Claritin), cold sores (Carmex), cough (Robitussin DM), eye irritation (Liquifilm Tears), hemorrhoids (Tucks), indigestion (Maalox), minor skin irritation  (hydrocortisone cream), muscle pain Lucienne Gay), nose irritation (saline nasal spray) and sore throat (Chloraseptic spray).   Full code   Consult to hospitalist   Consult to neurology Consult Timeframe: ROUTINE - requires response within 24 hours; Reason for Consult? Acute pontine stroke.   Consult to Registered Dietitian   Consult to Transition of Care Team   OT eval and treat   PT eval and treat   Oxygen therapy Mode or (Route): Nasal cannula; Liters Per Minute: 2; Keep O2 saturation between: greater than 94 %   SLP eval and treat Reason for evaluation: Cognitive/Language evaluation   CBG monitoring, ED   I-stat chem 8, ED  ED EKG   EKG 12-Lead   ECHOCARDIOGRAM COMPLETE BUBBLE STUDY   Saline lock IV   Admit to Inpatient (patient's expected length of stay will be greater than 2 midnights or inpatient only procedure)   Fall precautions   Aspiration precautions

## 2023-01-29 NOTE — Assessment & Plan Note (Signed)
 Free T4 and TSH.

## 2023-01-29 NOTE — Assessment & Plan Note (Signed)
 Vitals:   01/29/23 1646 01/29/23 1900 01/29/23 1945 01/29/23 2000  BP: (!) 155/90 (!) 168/87 (!) 143/90 (!) 150/87   01/29/23 2030 01/29/23 2100 01/29/23 2115 01/29/23 2130  BP: (!) 160/90 (!) 154/92 (!) 141/88 (!) 148/89   01/29/23 2200  BP: (!) 167/77  Permissive HTN tonight.  Prn hydralazine  with SBP 220 and above.

## 2023-01-29 NOTE — Assessment & Plan Note (Signed)
 2/2 to acute stroke , although the infarct is on right side and left eye has nystagmus not ipsilateral to what I would expect. We will get MRA head and neck with contrast.  Eye patch and ophthalmology consult if unresolved.    Marland Kitchen

## 2023-01-29 NOTE — ED Provider Notes (Signed)
 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  The patient reports that yesterday morning when he woke up he felt okay, he had something to eat and then went and took a nap, when he woke up later in the morning he noticed that he had diplopia which goes away when he closes either of his eyes.  He feels like he cannot walk because of the diplopia and feels like his balance is off.  He denies any other weakness or numbness or speech problems, on exam the patient appears to have some slight facial droop but also has the inability of his right eye to past the midline to the left.  He also has a problem with the right eye drifting upwards in a vertical pattern.  Looking to the left induces nystagmus in the left eye but not looking to the right.  The patient has likely had some type of stroke, this could be a nerve palsy however given the severity of this and that he is not a diabetic we will consult with neurology and get a workup for stroke.  He did take a aspirin  yesterday and today  Last seen normal was over 24 hours ago, he is not within the stroke window for acute treatment or thrombectomy  Final diagnoses:  None      Cleotilde Rogue, MD 01/31/23 2003

## 2023-01-29 NOTE — Assessment & Plan Note (Signed)
 UDS is pending. Pt denies using any substance or alcohol.

## 2023-01-29 NOTE — ED Notes (Signed)
 ED TO INPATIENT HANDOFF REPORT  ED Nurse Name and Phone #:   S Name/Age/Gender Alm Molt 68 y.o. male Room/Bed: APA09/APA09  Code Status   Code Status: Full Code  Home/SNF/Other Home Patient oriented to: self, place, time, and situation Is this baseline? Yes   Triage Complete: Triage complete  Chief Complaint Diplopia [H53.2]  Triage Note Pt BIB family for possible stroke. Pt states symptoms started yesterday morning when awaking. EDP in triage at this time.    Allergies Allergies  Allergen Reactions   Penicillins Other (See Comments)    Child hood reaction    Level of Care/Admitting Diagnosis ED Disposition     ED Disposition  Admit   Condition  --   Comment  Hospital Area: [facility] [[id number]]  Level of Care: Telemetry [5]  Covid Evaluation: Asymptomatic - no recent exposure (last 10 days) testing not required  Diagnosis: Diplopia [368.2.ICD-9-CM]  Admitting Physician: TOBIE MARIO LULLA DEDRA  Attending Physician: PATEL, EKTA V [[id number]]  Certification:: I certify this patient will need inpatient services for at least 2 midnights  Expected Medical Readiness: 02/01/2023          B Medical/Surgery History Past Medical History:  Diagnosis Date   Dislocated shoulder    Past Surgical History:  Procedure Laterality Date   left shoulder dislocation Left    SHOULDER CLOSED REDUCTION Left 06/28/2013   Procedure: CLOSED REDUCTION LEFT SHOULDER UNDER ANESTHESIA;  Surgeon: Taft FORBES Minerva, MD;  Location: [facility];  Service: Orthopedics;  Laterality: Left;     A IV Location/Drains/Wounds Patient Lines/Drains/Airways Status     Active Line/Drains/Airways     Name Placement date Placement time Site Days   Peripheral IV 01/29/23 20 G Posterior;Right Hand 01/29/23  1730  Hand  less than 1            Intake/Output Last 24 hours No intake or output data in the 24 hours ending 01/29/23 2040  Labs/Imaging Results for orders placed or  performed during the hospital encounter of 01/29/23 (from the past 48 hours)  CBG monitoring, ED     Status: Abnormal   Collection Time: 01/29/23  4:39 PM  Result Value Ref Range   Glucose-Capillary 137 (H) 70 - 99 mg/dL    Comment: Glucose reference range applies only to samples taken after fasting for at least 8 hours.  Urinalysis, Routine w reflex microscopic -Urine, Clean Catch     Status: None   Collection Time: 01/29/23  4:43 PM  Result Value Ref Range   Color, Urine YELLOW YELLOW   APPearance CLEAR CLEAR   Specific Gravity, Urine 1.006 1.005 - 1.030   pH 6.0 5.0 - 8.0   Glucose, UA NEGATIVE NEGATIVE mg/dL   Hgb urine dipstick NEGATIVE NEGATIVE   Bilirubin Urine NEGATIVE NEGATIVE   Ketones, ur NEGATIVE NEGATIVE mg/dL   Protein, ur NEGATIVE NEGATIVE mg/dL   Nitrite NEGATIVE NEGATIVE   Leukocytes,Ua NEGATIVE NEGATIVE    Comment: Performed at [facility], [address], [place], [place] [address]  Ethanol     Status: None   Collection Time: 01/29/23  4:59 PM  Result Value Ref Range   Alcohol, Ethyl (B) <10 <10 mg/dL    Comment: (NOTE) Lowest detectable limit for serum alcohol is 10 mg/dL.  For medical purposes only. Performed at [facility], [address], [place], [place] [address]   CBC     Status: None   Collection Time: 01/29/23  4:59 PM  Result Value Ref Range  WBC 8.9 4.0 - 10.5 K/uL   RBC 5.24 4.22 - 5.81 MIL/uL   Hemoglobin 16.3 13.0 - 17.0 g/dL   HCT 52.1 60.9 - 47.9 %   MCV 91.2 80.0 - 100.0 fL   MCH 31.1 26.0 - 34.0 pg   MCHC 34.1 30.0 - 36.0 g/dL   RDW 88.0 88.4 - 84.4 %   Platelets 247 150 - 400 K/uL   nRBC 0.0 0.0 - 0.2 %    Comment: Performed at [facility], [address] [place], [place] [address]  Differential     Status: None   Collection Time: 01/29/23  4:59 PM  Result Value Ref Range   Neutrophils Relative % 69 %   Neutro Abs 6.2 1.7 - 7.7 K/uL   Lymphocytes Relative 19 %   Lymphs Abs 1.7 0.7 - 4.0 K/uL   Monocytes Relative 9 %    Monocytes Absolute 0.8 0.1 - 1.0 K/uL   Eosinophils Relative 2 %   Eosinophils Absolute 0.2 0.0 - 0.5 K/uL   Basophils Relative 1 %   Basophils Absolute 0.1 0.0 - 0.1 K/uL   Immature Granulocytes 0 %   Abs Immature Granulocytes 0.02 0.00 - 0.07 K/uL    Comment: Performed at [facility], [address], [place], [place] [address]  Comprehensive metabolic panel     Status: Abnormal   Collection Time: 01/29/23  4:59 PM  Result Value Ref Range   Sodium 134 (L) 135 - 145 mmol/L   Potassium 4.0 3.5 - 5.1 mmol/L   Chloride 105 98 - 111 mmol/L   CO2 21 (L) 22 - 32 mmol/L   Glucose, Bld 117 (H) 70 - 99 mg/dL    Comment: Glucose reference range applies only to samples taken after fasting for at least 8 hours.   BUN 11 8 - 23 mg/dL   Creatinine, Ser 9.15 0.61 - 1.24 mg/dL   Calcium  9.0 8.9 - 10.3 mg/dL   Total Protein 7.3 6.5 - 8.1 g/dL   Albumin 3.7 3.5 - 5.0 g/dL   AST 20 15 - 41 U/L   ALT 32 0 - 44 U/L   Alkaline Phosphatase 149 (H) 38 - 126 U/L   Total Bilirubin 0.8 0.0 - 1.2 mg/dL    Comment: RESULT CONFIRMED BY MANUAL DILUTION   GFR, Estimated >60 >60 mL/min    Comment: (NOTE) Calculated using the CKD-EPI Creatinine Equation (2021)    Anion gap 8 5 - 15    Comment: Performed at [facility], [address] [place], [place] [address]  I-stat chem 8, ED     Status: Abnormal   Collection Time: 01/29/23  5:36 PM  Result Value Ref Range   Sodium 139 135 - 145 mmol/L   Potassium 4.1 3.5 - 5.1 mmol/L   Chloride 105 98 - 111 mmol/L   BUN 10 8 - 23 mg/dL   Creatinine, Ser 9.09 0.61 - 1.24 mg/dL   Glucose, Bld 883 (H) 70 - 99 mg/dL    Comment: Glucose reference range applies only to samples taken after fasting for at least 8 hours.   Calcium , Ion 1.20 1.15 - 1.40 mmol/L   TCO2 21 (L) 22 - 32 mmol/L   Hemoglobin 17.0 13.0 - 17.0 g/dL   HCT 49.9 60.9 - 47.9 %   MR BRAIN WO CONTRAST Result Date: 01/29/2023 CLINICAL DATA:  Blurred vision and dizziness beginning yesterday. The  examination had to be discontinued prior to completion due to severe claustrophobia. EXAM: MRI HEAD  WITHOUT CONTRAST TECHNIQUE: Multiplanar, multiecho pulse sequences of the brain and surrounding structures were obtained without intravenous contrast. COMPARISON:  CT head and cervical spine without contrast 12/16/2013. FINDINGS: Brain: Diffusion-weighted images demonstrate a punctate focus of restricted diffusion in the right paramedian posterior pons. No other restricted diffusion is present. A heterogeneous lesion in the left parotid gland measures 15 mm. A left parotid lesion was present the cervical spine CT scan of 2015 in retrospect. Other focal lesions are present. IMPRESSION: 1. Punctate focus of restricted diffusion in the right paramedian posterior pons compatible with a punctate acute infarct. This may involve the pathway of the fourth cranial nerve or the sixth nerve nucleus corresponding to the abnormal eye movements. 2. 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 A left parotid lesion was present the cervical spine CT scan of 2015 in retrospect. Recommend non emergent CT of the neck with contrast and ENT referral. These results were called by telephone at the time of interpretation on 01/29/2023 at 6:24 pm to provider Dr. Redell Pinal, who verbally acknowledged these results. Electronically Signed   By: Lonni Necessary M.D.   On: 01/29/2023 18:24    Pending Labs Unresulted Labs (From admission, onward)     Start     Ordered   01/29/23 2020  Lipid panel  (Labs)  Add-on,   AD       Comments: Fasting    01/29/23 2031   01/29/23 2017  HIV Antibody (routine testing w rflx)  (HIV Antibody (Routine testing w reflex) panel)  Once,   R        01/29/23 2031   01/29/23 1643  Urine rapid drug screen (hosp performed)  Once,   STAT        01/29/23 1643            Vitals/Pain Today's Vitals   01/29/23 1900 01/29/23 1945 01/29/23 2000 01/29/23 2030  BP: (!) 168/87 (!) 143/90 (!)  150/87 (!) 160/90  Pulse: 80 83 80 76  Resp: 15 (!) 22 16 (!) 21  Temp:      SpO2: 95% 95% 94% 92%  Weight:      Height:      PainSc:        Isolation Precautions No active isolations  Medications Medications   stroke: early stages of recovery book (has no administration in time range)  0.9 %  sodium chloride  infusion (has no administration in time range)  acetaminophen  (TYLENOL ) tablet 650 mg (has no administration in time range)    Or  acetaminophen  (TYLENOL ) 160 MG/5ML solution 650 mg (has no administration in time range)    Or  acetaminophen  (TYLENOL ) suppository 650 mg (has no administration in time range)  heparin  injection 5,000 Units (has no administration in time range)  aspirin  EC tablet 81 mg (has no administration in time range)  pantoprazole  (PROTONIX ) injection 40 mg (has no administration in time range)  atorvastatin  (LIPITOR) tablet 40 mg (has no administration in time range)  haloperidol  lactate (HALDOL ) injection 5 mg (5 mg Intravenous Given 01/29/23 1734)  diazepam  (VALIUM ) injection 5 mg (5 mg Intravenous Given 01/29/23 1735)    Mobility Walks independent at home; states balance is off since symptoms started     Focused Assessments Neuro Assessment Handoff:  Swallow screen pass? No  Cardiac Rhythm: Normal sinus rhythm NIH Stroke Scale  Dizziness Present: No Headache Present: No Interval: Other (Comment) Level of Consciousness (1a.)   : Alert, keenly responsive LOC Questions (1b. )   :  Answers both questions correctly LOC Commands (1c. )   : Performs both tasks correctly Best Gaze (2. )  : Partial gaze palsy Visual (3. )  : No visual loss Facial Palsy (4. )    : Normal symmetrical movements Motor Arm, Left (5a. )   : No drift Motor Arm, Right (5b. ) : No drift Motor Leg, Left (6a. )  : No drift Motor Leg, Right (6b. ) : No drift Limb Ataxia (7. ): Absent Sensory (8. )  : Normal, no sensory loss Best Language (9. )  : No aphasia Dysarthria (10.  ): Normal Extinction/Inattention (11.)   : No Abnormality Complete NIHSS TOTAL: 1 Last date known well: 01/28/23 Last time known well: 0800 Neuro Assessment:   Neuro Checks:   Initial (01/29/23 1654)  Has TPA been given? No If patient is a Neuro Trauma and patient is going to OR before floor call report to 4N Charge nurse: [phone] or [phone]   R Recommendations: See Admitting Provider Note  Report given to:   Additional Notes: A&O / failed swallow screen / can use urinal sitting on the side of the bed /  wife at bedside /  on RA /

## 2023-01-30 ENCOUNTER — Inpatient Hospital Stay (HOSPITAL_COMMUNITY): Payer: Medicare Other

## 2023-01-30 ENCOUNTER — Other Ambulatory Visit (HOSPITAL_COMMUNITY): Payer: Self-pay | Admitting: *Deleted

## 2023-01-30 DIAGNOSIS — H532 Diplopia: Secondary | ICD-10-CM

## 2023-01-30 DIAGNOSIS — I6389 Other cerebral infarction: Secondary | ICD-10-CM

## 2023-01-30 DIAGNOSIS — I639 Cerebral infarction, unspecified: Secondary | ICD-10-CM | POA: Diagnosis not present

## 2023-01-30 DIAGNOSIS — F1411 Cocaine abuse, in remission: Secondary | ICD-10-CM | POA: Diagnosis not present

## 2023-01-30 DIAGNOSIS — H5500 Unspecified nystagmus: Secondary | ICD-10-CM | POA: Diagnosis not present

## 2023-01-30 LAB — ECHOCARDIOGRAM COMPLETE BUBBLE STUDY
Area-P 1/2: 2.45 cm2
P 1/2 time: 452 ms
S' Lateral: 2.1 cm

## 2023-01-30 LAB — TSH: TSH: 0.475 u[IU]/mL (ref 0.350–4.500)

## 2023-01-30 LAB — T4, FREE: Free T4: 1.16 ng/dL — ABNORMAL HIGH (ref 0.61–1.12)

## 2023-01-30 LAB — VITAMIN B12: Vitamin B-12: 270 pg/mL (ref 180–914)

## 2023-01-30 LAB — HIV ANTIBODY (ROUTINE TESTING W REFLEX): HIV Screen 4th Generation wRfx: NONREACTIVE

## 2023-01-30 MED ORDER — CLOPIDOGREL BISULFATE 75 MG PO TABS
75.0000 mg | ORAL_TABLET | Freq: Every day | ORAL | Status: DC
  Administered 2023-01-30 – 2023-01-31 (×2): 75 mg via ORAL
  Filled 2023-01-30 (×2): qty 1

## 2023-01-30 MED ORDER — ORAL CARE MOUTH RINSE: 15.0000 mL | OROMUCOSAL | Status: DC | PRN

## 2023-01-30 NOTE — Plan of Care (Signed)
  Problem: Nutrition: Goal: Risk of aspiration will decrease Outcome: Progressing   Problem: Education: Goal: Knowledge of General Education information will improve Description: Including pain rating scale, medication(s)/side effects and non-pharmacologic comfort measures Outcome: Progressing   Problem: Activity: Goal: Risk for activity intolerance will decrease Outcome: Progressing

## 2023-01-30 NOTE — Progress Notes (Signed)
*  PRELIMINARY RESULTS* Echocardiogram 2D Echocardiogram has been performed with saline bubble study.  Greg Fry 01/30/2023, 2:30 PM

## 2023-01-30 NOTE — Discharge Instructions (Signed)
Heart-Healthy Nutrition Therapy  A heart-healthy diet is recommended to reduce your unhealthy blood cholesterol levels, manage high blood pressure, and lower your risk for heart disease. To follow a heart-healthy diet, Eat a balanced diet with whole grains, fruits and vegetables, and lean protein sources. Achieve and maintain a healthy weight. Choose heart-healthy unsaturated fats. Limit saturated fats, trans fats, and cholesterol intake. Eat more plant-based or vegetarian meals using beans and soy foods for protein. Eat whole, unprocessed foods to limit the amount of sodium (salt) you eat. Limit refined carbohydrates especially sugar, sweets and sugar-sweetened beverages. If you drink alcohol, do so in moderation: one serving per day (women) and two servings per day (men). One serving is equivalent to 12 ounces beer, 5 ounces wine, or 1.5 ounces distilled spirits  Tips Tips for Choosing Heart-Healthy Fats Choose lean protein and low-fat dairy foods to reduce saturated fat intake. Saturated fat is usually found in animal-based protein and is associated with certain health risks. Saturated fat is the biggest contributor to raised low-density lipoprotein (LDL) cholesterol levels in the diet. Research shows that limiting saturated fat lowers unhealthy cholesterol levels. Eat no more than 5-6% of your total calories each day from saturated fat. Ask your registered dietitian nutritionist (RDN) to help you determine how much saturated fat is right for you. There are many foods that do not contain large amounts of saturated fats. Swapping these foods to replace foods high in saturated fats will help you limit the saturated fat you eat and improve your cholesterol levels. You can also try eating more plant-based or vegetarian meals. Instead of. Try:  Whole milk, cheese, yogurt, and ice cream 1%, %, or skim milk, low-fat cheese, non-fat yogurt, and low-fat ice cream  Fatty, marbled beef and pork Lean  beef, pork, or venison  Poultry with skin Poultry without skin  Butter, stick margarine Reduced-fat, whipped, or liquid spreads  Coconut oil, palm oil Liquid vegetable oils: corn, canola, olive, soybean and safflower oils   Avoid trans fats. Trans fats increase levels of LDL-cholesterol. Hydrogenated fat in processed foods is the main source of trans fats in foods.  Trans fats can be found in stick margarine, shortening, processed sweets, baked goods, some fried foods, and packaged foods made with hydrogenated oils. Avoid foods with "partially hydrogenated oil" on the ingredient list such as: cookies, pastries, baked goods, biscuits, crackers, microwave popcorn, and frozen dinners. Choose foods with heart healthy fats. Polyunsaturated and monounsaturated fat are unsaturated fats that may help lower your blood cholesterol level when used in place of saturated fat in your diet. Ask your RDN about taking a dietary supplement with plant sterols and stanols to help lower your cholesterol level. Research shows that substituting saturated fats with unsaturated fats is beneficial to cholesterol levels. Try these easy swaps:   Instead of. Try:  Butter, stick margarine, or solid shortening Reduced-fat, whipped, or liquid spreads  Beef, pork, or poultry with skin   Fish and seafood  Chips, crackers, snack foods Raw or unsalted nuts and seeds or nut butters Hummus with vegetables Avocado on toast  Coconut oil, palm oil Liquid vegetable oils: corn, canola, olive, soybean and safflower oils    Limit the amount of cholesterol you eat to less than 200 milligrams per day. Cholesterol is a substance carried through the bloodstream via lipoproteins, which are known as "transporters" of fat. Some body functions need cholesterol to work properly, but too much cholesterol in the bloodstream can damage arteries and build up blood   vessel linings (which can lead to heart attack and stroke). You should eat less than  200 milligrams cholesterol per day. People respond differently to eating cholesterol. There is no test available right now that can figure out which people will respond more to dietary cholesterol and which will respond less. For individuals with high intake of dietary cholesterol, different types of increase (none, small, moderate, large) in LDL-cholesterol levels are all possible.   Food sources of cholesterol include egg yolks and organ meats such as liver, gizzards.  Limit egg yolks to two to four per week and avoid organ meats like liver and gizzards to control cholesterol intake.  Tips for Choosing Heart-Healthy Carbohydrates Consume foods rich in viscous (soluble) fiber Viscous, or soluble, fiber is found in the walls of plant cells. Viscous fiber is found only in plant-based foods--animal-based foods like meat or dairy products do not contain fiber. In the stomach, viscous fibers absorb water and swell to form a thick, jelly-like mass. This helps to lower your unhealthy cholesterol Rich sources of viscous fiber include asparagus, Brussels sprouts, sweet potatoes, turnips, apricots, mangoes, oranges, legumes, barley, oats, and oat bran. Eat at least 5 to 10 grams of viscous fiber each day. As you increase your fiber intake gradually, also increase the amount of water you drink. This will help prevent constipation. If you have difficulty achieving this goal, ask your RDN about fiber laxatives. Choose fiber supplements made with viscous fibers such as psyllium seed husks or methylcellulose to help lower unhealthy cholesterol. Limit refined carbohydrates There are three types of carbohydrates: starches, sugar, and fiber. Some carbohydrates occur naturally in food, like the starches in rice or corn or the sugars in fruits and milk. Refined carbohydrates--foods with high amounts of simple sugars--can raise triglyceride levels. High triglyceride levels are associated with coronary heart disease. Some  examples of refined carbohydrate foods are table sugar, sweets, and beverages sweetened with added sugar. Tips for Reducing Sodium (Salt) Although sodium is important for your body to function, too much sodium can be harmful for people with high blood pressure.  As sodium and fluid buildup in your tissues and bloodstream, your blood pressure increases. High blood pressure may cause damage to other organs and increase your risk for a stroke. Keep your salt intake to 2300 milligrams or less per day. Even if you take a pill for blood pressure or a water pill (diuretic) to remove fluid, it is still important to have less salt in your diet. Ask your RDN what amount of sodium is right for you. Avoid processed foods.  Eat more fresh foods. Fresh fruits and vegetables are naturally low in sodium, as well as frozen vegetables and fruits that have no added juices or sauces. Fresh meats are lower in sodium than processed meats, such as bacon, sausage, and hotdogs.  Read the nutrition label or ask your butcher to help you find a fresh meat that is low in sodium. Eat less salt--at the table and when cooking. A single teaspoon of table salt has 2,300 mg of sodium. Leave the salt out of recipes for pasta, casseroles, and soups. Ask your RDN how to cook your favorite recipes without sodium Be a smart shopper. Look for food packages that say "salt-free" or "sodium-free." These items contain less than 5 milligrams of sodium per serving. "Very low-sodium" products contain less than 35 milligrams of sodium per serving. "Low-sodium" products contain less than 140 milligrams of sodium per serving.  Beware of "reduced salt" or "reduced   sodium" products.  These items may still be high in sodium. Check the nutrition label.  Add flavors to your food without adding sodium. Try lemon juice, lime juice, fruit juice or vinegar.   Dry or fresh herbs add flavor. Try basil, bay leaf, dill, rosemary, parsley, sage, dry mustard,  nutmeg, thyme, and paprika. Pepper, red pepper flakes, and cayenne pepper can add spice to your meals without adding sodium. Hot sauce contains sodium, but if you use just a drop or two, it will not add up to much. Buy a sodium-free seasoning blend or make your own at home.    Additional Lifestyle Tips Achieve and maintain a healthy weight. Talk with your RDN or your doctor about what is a healthy weight for you. Set goals to reach and maintain that weight.  To lose weight, reduce your calorie intake along with increasing your physical activity. A weight loss of 10 to 15 pounds could reduce LDL-cholesterol by 5 milligrams per deciliter. Participate in physical activity. Talk with your health care team to find out what types of physical activity are best for you. Set a plan to get about 30 minutes of exercise on most days.   Foods to Choose or to Limit Food Group Foods to Choose Foods to Limit  Grains Whole grain breads and cereals, including whole wheat, barley, rye, buckwheat, corn, teff, quinoa, millet, amaranth, brown or wild rice, sorghum, and oats Pasta, especially whole wheat or other whole grain types Brown rice, quinoa or wild rice Whole grain crackers, bread, rolls, pitas Home-made bread with reduced-sodium baking soda Breads or crackers topped with salt Cereals (hot or cold) with more than 300 mg sodium per serving Biscuits, cornbread, and other "quick" breads prepared with baking soda Bread crumbs or stuffing mix from a store High-fat bakery products, such as doughnuts, biscuits, croissants, danish pastries, pies, cookies Instant cooking foods to which you add hot water and stir--potatoes, noodles, rice, etc. Packaged starchy foods--seasoned noodle or rice dishes, stuffing mix, macaroni and cheese dinner Snacks made with partially hydrogenated oils, including chips, cheese puffs, snack mixes, regular crackers, butter-flavored popcorn  Protein Foods Lean cuts of beef and pork  (loin, leg, round, extra lean hamburger) Skinless poultry Fish Venison and other wild game Dried beans and peas Nuts and nut butters (unsalted) Seeds and seed butters (unsalted) Meat alternatives made with soy or textured vegetable protein Egg whites or egg substitute Cold cuts made with lean meat or soy protein Higher-fat cuts of meats (ribs, t-bone steak, regular hamburger) Bacon, sausage, or hot dogs Cold cuts, such as salami or bologna, deli meats, cured meats, corned beef Organ meats (liver, brains, gizzards, sweetbreads) Poultry with skin Fried or smoked meat, poultry, and fish Whole eggs and egg yolks (more than 2-4 per week) Salted legumes, nuts, seeds, or nut/seed butters Meat alternatives with high levels of sodium (>300 mg per serving) or saturated fat (>5 g per serving)  Dairy and Dairy Alternatives Nonfat (skim), low-fat, or 1%-fat milk Nonfat or low-fat yogurt or cottage cheese Fat-free and low-fat cheese Fortified non-dairy milk: almond, cashew, pea, and soy Whole milk, 2% fat milk, buttermilk Whole milk yogurt or ice cream Cream, half-&-half Cream cheese Sour cream Cheese  Vegetables Fresh, frozen, or canned vegetables without added fat or salt Avocados Canned or frozen vegetables with salt, fresh vegetables prepared with salt, butter, cheese, or cream sauce Fried vegetables Pickled vegetables such as olives, pickles, or sauerkraut  Fruits Fresh, frozen, canned, or dried fruit Fried fruits Fruits   served with butter or cream  Oils Unsaturated oils (corn, olive, peanut, soy, sunflower, canola) Soft or liquid margarines and vegetable oil spreads Salad dressings made from saturated fats Butter, stick margarine, shortening Partially hydrogenated oils or trans fats Tropical oils (coconut, palm, palm kernel oils)  Other Prepared or homemade foods, including soups, casseroles, and salads made from recommended ingredients and contain <600 mg sodium. Low-sodium seasonings  (ketchup, barbeque sauce) Spices, herbs, Salt-free seasoning mixes and marinades Vinegar Lemon or lime juice Prepared foods, including soups, casseroles, and salads made from recommended ingredients and contain >600 mg sodium. Frozen meals and prepared sides that are >600 mg of sodium Sugary and/or fatty desserts, candy, and other sweets Salts:  sea salt, kosher salt, onion salt, and garlic salt, seasoning mixes containing salt Flavorings: bouillon cubes, catsup or ketchup, barbeque sauce, Worcestershire sauce, soy sauce, salsa, relish, teriyaki sauce   Heart-Healthy Sample 1-Day Menu View Nutrient Info Breakfast 1 cup oatmeal 1 cup fat-free milk 1 cup blueberries 1 ounce almonds, unsalted 1 cup brewed coffee  Lunch 2 slices whole-wheat bread 2 ounces lean turkey breast 1 ounce low-fat Swiss cheese 2 slices tomato 2 lettuce leaves 1 pear 1 cup skim milk  Afternoon Snack 1 ounce trail mix with unsalted nuts, seeds, and raisins  Evening Meal 3 ounces broiled salmon 2/3 cup brown rice 1 teaspoon margarine, soft tub  cup broccoli, cooked  cup carrots, cooked 1 cup tossed salad 1 teaspoon olive oil and vinegar dressing 1 small whole-wheat roll 1 teaspoon margarine, soft tub 1 cup tea  Evening Snack 1 small banana  Daily Sum Nutrient Unit Value  Macronutrients  Energy kcal 2069  Energy kJ 8655  Protein g 146  Total lipid (fat) g 69  Carbohydrate, by difference g 228  Fiber, total dietary g 33  Sugars, total g 85  Minerals  Calcium, Ca mg 1292  Iron, Fe mg 14  Sodium, Na mg 1751  Vitamins  Vitamin C, total ascorbic acid mg 85  Vitamin A, IU IU 17756  Vitamin D IU 231  Lipids  Fatty acids, total saturated g 12  Fatty acids, total monounsaturated g 27  Fatty acids, total polyunsaturated g 24  Cholesterol mg 270     Heart-Healthy Vegan 1-Day Sample Menu View Nutrient Info Breakfast 1 slice whole wheat toast 2 tablespoons peanut butter without salt Tofu scramble  made with:  cup calcium-set tofu  cup green pepper  cup spinach  cup tomatoes  cup white mushrooms 1 teaspoon canola oil 1 cup soymilk fortified with calcium, vitamin B12, and vitamin D  Lunch 1 cup reduced sodium split pea soup 1 whole wheat dinner roll 1 medium apple  Dinner Salad made with: 1 cup lentils  cup cooked broccoli  cup cooked carrots 2 tablespoons hummus 1 cup sliced strawberries 1 cup soymilk fortified with calcium, vitamin B12, and vitamin D  Evening Snack 1 cup soy yogurt  cup mixed nuts  Daily Sum Nutrient Unit Value  Macronutrients  Energy kcal 1672  Energy kJ 7000  Protein g 86  Total lipid (fat) g 65  Carbohydrate, by difference g 205  Fiber, total dietary g 47  Sugars, total g 73  Minerals  Calcium, Ca mg 1443  Iron, Fe mg 19  Sodium, Na mg 1148  Vitamins  Vitamin C, total ascorbic acid mg 253  Vitamin A, IU IU 15451  Vitamin D IU 361  Lipids  Fatty acids, total saturated g 11  Fatty acids, total monounsaturated g   29  Fatty acids, total polyunsaturated g 19  Cholesterol mg 5     Heart-Healthy Vegetarian (Lacto-Ovo) Sample 1-Day Menu View Nutrient Info Breakfast 1 cup cooked oatmeal 1 tablespoon ground flaxseed 1 cup blueberries 2 scrambled egg whites with 1 teaspoon canola oil 2 tablespoons salsa 1 cup fat-free milk 1 cup coffee Oil, canola  Lunch 2 slices whole wheat bread 2 tablespoons peanut butter without salt 1 small banana 6 ounces fat-free plain yogurt 1 cup sliced red pepper 2 tablespoons hummus 1 cup fat-free milk  Evening Meal Stir fry made with:  cup tofu 1 cup brown rice  cup cooked broccoli  cup cooked carrots  cup cooked green beans 1 teaspoon peanut oil  Evening Snack 1 slice low-fat mozzarella cheese 1 medium apple  Daily Sum Nutrient Unit Value  Macronutrients  Energy kcal 1764  Energy kJ 7375  Protein g 87  Total lipid (fat) g 53  Carbohydrate, by difference g 254  Fiber, total dietary g 35   Sugars, total g 98  Minerals  Calcium, Ca mg 1609  Iron, Fe mg 12  Sodium, Na mg 1434  Vitamins  Vitamin C, total ascorbic acid mg 210  Vitamin A, IU IU 16317  Vitamin D IU 234  Lipids  Fatty acids, total saturated g 12  Fatty acids, total monounsaturated g 21  Fatty acids, total polyunsaturated g 15  Cholesterol mg 31    Copyright 2020  [employer]. All rights reserved  

## 2023-01-30 NOTE — Progress Notes (Signed)
 SLP Cancellation Note  Patient Details Name: Julien Oscar MRN: [id number] DOB: [birth year]   Cancelled treatment:       Reason Eval/Treat Not Completed: SLP screened, no needs identified, will sign off; Pt only endorses vision changes.  Thank you,  Lamar Candy, CCC-SLP [phone]    Lyle Leisner 01/30/2023, 1:59 PM

## 2023-01-30 NOTE — Evaluation (Signed)
 Physical Therapy Evaluation Patient Details Name: Greg Fry MRN: [id number] DOB: 1955-01-20 Today's Date: 01/30/2023  History of Present Illness  Greg Fry is a 68 y.o. male with past medical history  of cocaine use, allergies to penicillin, ? Lazy eye, elevated blood pressures-question whitecoat syndrome, history of tobacco abuse, presenting today with double vision that he noticed today morning and resolved with closing either of the eyes.  He could not walk because he felt off balance.  Mild facial droop per report patient took an aspirin  yesterday he does not take aspirin  at baseline.  Today morning when his sister saw him she told him he needed to go to the hospital immediately.   Clinical Impression  Patient functioning near baseline other than having visual deficits and able to ambulate in room, hallways without loss of balance, occasional drifting left/right mostly due to double vision, but able to self correct without bumping in to objects.  Patient encouraged to follow up with OP neurology and or eye specialist for visual deficits.  Plan:  Patient discharged from physical therapy to care of nursing for ambulation daily as tolerated for length of stay.       If plan is discharge home, recommend the following: Help with stairs or ramp for entrance   Can travel by private vehicle        Equipment Recommendations None recommended by PT  Recommendations for Other Services       Functional Status Assessment Patient has had a recent decline in their functional status and/or demonstrates limited ability to make significant improvements in function in a reasonable and predictable amount of time     Precautions / Restrictions Precautions Precautions: Fall Restrictions Weight Bearing Restrictions Per Provider Order: No      Mobility  Bed Mobility Overal bed mobility: Independent                  Transfers Overall transfer level: Independent                       Ambulation/Gait Ambulation/Gait assistance: Modified independent (Device/Increase time) Gait Distance (Feet): 200 Feet Assistive device: None Gait Pattern/deviations: WFL(Within Functional Limits) Gait velocity: slightly decreased     General Gait Details: grossly WFL with occasional drifting left/right mostly due to visual deficits, no loss of balance and demonstrates good return for ambulating in room, hallways without loss of balance  Stairs            Wheelchair Mobility     Tilt Bed    Modified Rankin (Stroke Patients Only)       Balance Overall balance assessment: Mild deficits observed, not formally tested                                           Pertinent Vitals/Pain Pain Assessment Pain Assessment: No/denies pain    Home Living Family/patient expects to be discharged to:: Private residence Living Arrangements: Spouse/significant other Available Help at Discharge: Available 24 hours/day;Other (Comment) Type of Home: House Home Access: Stairs to enter   Entrance Stairs-Number of Steps: 20 in back; 15 in front Alternate Level Stairs-Number of Steps: 20 Home Layout: Multi-level Home Equipment: None      Prior Function Prior Level of Function : Independent/Modified Independent             Mobility Comments: Independent; drives ADLs Comments: Independent  Extremity/Trunk Assessment   Upper Extremity Assessment Upper Extremity Assessment: Defer to OT evaluation    Lower Extremity Assessment Lower Extremity Assessment: Overall WFL for tasks assessed    Cervical / Trunk Assessment Cervical / Trunk Assessment: Normal  Communication   Communication Communication: No apparent difficulties  Cognition Arousal: Alert Behavior During Therapy: WFL for tasks assessed/performed Overall Cognitive Status: Within Functional Limits for tasks assessed                                          General  Comments      Exercises     Assessment/Plan    PT Assessment Patient does not need any further PT services  PT Problem List         PT Treatment Interventions      PT Goals (Current goals can be found in the Care Plan section)  Acute Rehab PT Goals Patient Stated Goal: return home with family to assist PT Goal Formulation: With patient Time For Goal Achievement: 01/30/23 Potential to Achieve Goals: Good    Frequency       Co-evaluation PT/OT/SLP Co-Evaluation/Treatment: Yes Reason for Co-Treatment: To address functional/ADL transfers PT goals addressed during session: Mobility/safety with mobility;Balance         AM-PAC PT 6 Clicks Mobility  Outcome Measure Help needed turning from your back to your side while in a flat bed without using bedrails?: None Help needed moving from lying on your back to sitting on the side of a flat bed without using bedrails?: None Help needed moving to and from a bed to a chair (including a wheelchair)?: None Help needed standing up from a chair using your arms (e.g., wheelchair or bedside chair)?: None Help needed to walk in hospital room?: None Help needed climbing 3-5 steps with a railing? : A Little 6 Click Score: 23    End of Session   Activity Tolerance: Patient tolerated treatment well Patient left: in bed;with call bell/phone within reach;with family/visitor present Nurse Communication: Mobility status PT Visit Diagnosis: Unsteadiness on feet (R26.81);Other abnormalities of gait and mobility (R26.89);Muscle weakness (generalized) (M62.81)    Time: 9189-9163 PT Time Calculation (min) (ACUTE ONLY): 26 min   Charges:   PT Evaluation $PT Eval Moderate Complexity: 1 Mod PT Treatments $Therapeutic Activity: 23-37 mins PT General Charges $$ ACUTE PT VISIT: 1 Visit         2:22 PM, 01/30/23 Lynwood Music, MPT Physical Therapist with [facility] [phone] office [phone] mobile phone

## 2023-01-30 NOTE — Evaluation (Signed)
 Occupational Therapy Evaluation Patient Details Name: Greg Fry MRN: [id number] DOB: [birth year] Today's Date: 01/30/2023   History of Present Illness Greg Fry is a 68 y.o. male with past medical history  of cocaine use, allergies to penicillin, ? Lazy eye, elevated blood pressures-question whitecoat syndrome, history of tobacco abuse, presenting today with double vision that he noticed today morning and resolved with closing either of the eyes.  He could not walk because he felt off balance.  Mild facial droop per report patient took an aspirin  yesterday he does not take aspirin  at baseline.  Today morning when his sister saw him she told him he needed to go to the hospital immediately. (per MD)   Clinical Impression   Pt agreeable to OT and PT co-evaluation. Pt appears to be near baseline function other than diplopia that is still present. Diplopia did resolve with non-dominant eye occluded using glasses with tape which was given to the pt. Mild balance issues noted while ambulating which pt correlated with vision issues. Pt was left in the bed with family present. Pt is not recommended for further acute OT services and will be discharged to care of nursing staff for remaining length of stay.      If plan is discharge home, recommend the following: Assist for transportation    Functional Status Assessment  Patient has not had a recent decline in their functional status  Equipment Recommendations  None recommended by OT           Precautions / Restrictions Precautions Precautions: Fall Restrictions Weight Bearing Restrictions Per Provider Order: No      Mobility Bed Mobility Overal bed mobility: Independent                  Transfers Overall transfer level: Independent                        Balance Overall balance assessment: Mild deficits observed, not formally tested                                         ADL either performed or  assessed with clinical judgement   ADL Overall ADL's : Modified independent                                       General ADL Comments: More difficulty due to vision issues.     Vision Baseline Vision/History: 1 Wears glasses Ability to See in Adequate Light: 1 Impaired Patient Visual Report: Diplopia Vision Assessment?: Wears glasses for reading;Yes Tracking/Visual Pursuits: Other (comment) (Able to track but noted to have seemingly L directional nystagmus during horizontal tracking.) Convergence: Within functional limits Diplopia Assessment:  (Pt reports double vision that resolves with one eye occluded.) Additional Comments: Pt given glassess with tape over half of the visual field with instructions to slowly peel away the tape towards the pt's nose.     Perception Perception: Not tested       Praxis Praxis: Not tested       Pertinent Vitals/Pain Pain Assessment Pain Assessment: No/denies pain     Extremity/Trunk Assessment Upper Extremity Assessment Upper Extremity Assessment: Overall WFL for tasks assessed   Lower Extremity Assessment Lower Extremity Assessment: Defer to PT evaluation   Cervical / Trunk Assessment Cervical /  Trunk Assessment: Normal   Communication Communication Communication: No apparent difficulties   Cognition Arousal: Alert Behavior During Therapy: WFL for tasks assessed/performed Overall Cognitive Status: Within Functional Limits for tasks assessed                                                        Home Living Family/patient expects to be discharged to:: Private residence Living Arrangements: Spouse/significant other Available Help at Discharge: Available 24 hours/day;Other (Comment) (girlfriend) Type of Home: House Home Access: Stairs to enter [employer] of Steps: 20 in back; 15 in front Entrance Stairs-Rails:  (no rails in back; can reach both rails for front steps.) Home  Layout: Multi-level Alternate Level Stairs-Number of Steps: 20 Alternate Level Stairs-Rails: Left (going up) Bathroom Shower/Tub: Chief Strategy Officer: Standard Bathroom Accessibility: No   Home Equipment: None          Prior Functioning/Environment Prior Level of Function : Independent/Modified Independent             Mobility Comments: Independent; drives ADLs Comments: Independent                                Co-evaluation PT/OT/SLP Co-Evaluation/Treatment: Yes Reason for Co-Treatment: To address functional/ADL transfers   OT goals addressed during session: ADL's and self-care      AM-PAC OT 6 Clicks Daily Activity     Outcome Measure Help from another person eating meals?: None Help from another person taking care of personal grooming?: None Help from another person toileting, which includes using toliet, bedpan, or urinal?: None Help from another person bathing (including washing, rinsing, drying)?: None Help from another person to put on and taking off regular upper body clothing?: None Help from another person to put on and taking off regular lower body clothing?: None 6 Click Score: 24   End of Session Equipment Utilized During Treatment: Other (comment) (safety glasses with one eye mostly occluded to resolve diplopia.)  Activity Tolerance: Patient tolerated treatment well Patient left: in bed;with call bell/phone within reach;with family/visitor present  OT Visit Diagnosis: Unsteadiness on feet (R26.81);Other abnormalities of gait and mobility (R26.89);Low vision, both eyes (H54.2)                Time: 9186-9164 OT Time Calculation (min): 22 min Charges:  OT General Charges $OT Visit: 1 Visit OT Evaluation $OT Eval Low Complexity: 1 Low  Joscelin Fray OT, MOT   Jayson Person 01/30/2023, 9:23 AM

## 2023-01-30 NOTE — Progress Notes (Addendum)
 [facility]                                                                               Greg Fry, is a 68 y.o. male, DOB - Feb 02, 1955, FMW:[id number] Admit date - 01/29/2023    Outpatient Primary MD for the patient is Oley Bascom RAMAN, NP  LOS - 1  days    Brief summary    Greg Fry is a 68 y.o. male with past medical history  of cocaine use, allergies to penicillin, ? Lazy eye, elevated blood pressures-question whitecoat syndrome, history of tobacco abuse, presenting today with double vision that he noticed today morning and resolved with closing either of the eyes.  He could not walk because he felt off balance  He was admitted for evaluation of stroke.   Assessment & Plan    Assessment and Plan:   * Nystagmus/ Acute cerebral Infarction: 2/2 to acute stroke , MRI brain shos Punctate focus of restricted diffusion in the right paramedian posterior pons compatible with a punctate acute infarct. Started him on aspirin  81 mg , plavix  75 mg daily and statin.  MRA head and neck ordered.  Echocardiogram showed preserved LVEF.  Lipid panel is pending.  and A1c is  5.7%.  Therapy evaluation ordered.   History of cocaine abuse (HCC) UDS is positive for cocaine and benzos.    Elevated free t4 TSH wnl.  Recommend checking thyroid  panel in 4 weeks.    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    Uncontrolled hypertension Permissive hypertension and use hydralazine  for BP>220/120.  CURRENTLY ASYMPTOMATIC.      Estimated body mass index is 34.07 kg/m as calculated from the following:   Height as of this encounter: 5' 10 (1.778 m).   Weight as of this encounter: 107.7 kg.  Code Status: full code.  DVT Prophylaxis:  heparin  injection 5,000 Units Start: 01/29/23 2200   Level of Care: Level of care: Telemetry Family Communication: none at bedside.   Disposition Plan:     Remains  inpatient appropriate:  pending MRA   Procedures:  Echocardiogram.   Consultants:   Neurology will see the patient in am   Antimicrobials:   Anti-infectives (From admission, onward)    None        Medications  Scheduled Meds:  aspirin  EC  81 mg Oral Daily   atorvastatin   40 mg Oral Daily   heparin   5,000 Units Subcutaneous Q8H   pantoprazole  (PROTONIX ) IV  40 mg Intravenous Q24H   Continuous Infusions:  sodium chloride  50 mL/hr at 01/29/23 2312   PRN Meds:.acetaminophen  **OR** acetaminophen  (TYLENOL ) oral liquid 160 mg/5 mL **OR** acetaminophen , hydrALAZINE , mouth rinse    Subjective:   Greg Fry was seen and examined today.  Still having vision changes.  Objective:   Vitals:   01/30/23 0600 01/30/23 0912 01/30/23 1103 01/30/23 1430  BP: (!) 148/75 (!) 147/85 (!) 169/88 (!) 156/80  Pulse: 64 80 76 73  Resp: 18   20  Temp: 97.9 F (36.6 C) 98.7 F (37.1 C) 98.5 F (36.9 C) 98.8 F (37.1  C)  TempSrc: Oral Oral Oral Oral  SpO2: 95% 95% 94% 95%  Weight:      Height:        Intake/Output Summary (Last 24 hours) at 01/30/2023 1542 Last data filed at 01/30/2023 0300 Gross per 24 hour  Intake 3 ml  Output 240 ml  Net -237 ml   Filed Weights   01/29/23 1641 01/29/23 2257  Weight: 104.3 kg 107.7 kg     Exam General exam: Appears calm and comfortable  Respiratory system: Clear to auscultation. Respiratory effort normal. Cardiovascular system: S1 & S2 heard, RRR. No JVD,  Gastrointestinal system: Abdomen is nondistended, soft and nontender. Central nervous system: Alert and oriented. Nystagmus and vision changes.  Extremities: Symmetric 5 x 5 power. Skin: No rashes,  Psychiatry: mood is appropriate.    Data Reviewed:  I have personally reviewed following labs and imaging studies   CBC Lab Results  Component Value Date   WBC 8.9 01/29/2023   RBC 5.24 01/29/2023   HGB 17.0 01/29/2023   HCT 50.0 01/29/2023   MCV 91.2 01/29/2023   MCH 31.1  01/29/2023   PLT 247 01/29/2023   MCHC 34.1 01/29/2023   RDW 11.9 01/29/2023   LYMPHSABS 1.7 01/29/2023   MONOABS 0.8 01/29/2023   EOSABS 0.2 01/29/2023   BASOSABS 0.1 01/29/2023     Last metabolic panel Lab Results  Component Value Date   NA 139 01/29/2023   K 4.1 01/29/2023   CL 105 01/29/2023   CO2 21 (L) 01/29/2023   BUN 10 01/29/2023   CREATININE 0.90 01/29/2023   GLUCOSE 116 (H) 01/29/2023   GFRNONAA >60 01/29/2023   GFRAA >90 12/16/2013   CALCIUM  9.0 01/29/2023   PROT 7.3 01/29/2023   ALBUMIN 3.7 01/29/2023   LABGLOB 2.9 12/22/2022   BILITOT 0.8 01/29/2023   ALKPHOS 149 (H) 01/29/2023   AST 20 01/29/2023   ALT 32 01/29/2023   ANIONGAP 8 01/29/2023    CBG (last 3)  Recent Labs    01/29/23 1639  GLUCAP 137*      Coagulation Profile: No results for input(s): INR, PROTIME in the last 168 hours.   Radiology Studies: ECHOCARDIOGRAM COMPLETE BUBBLE STUDY Result Date: 01/30/2023    ECHOCARDIOGRAM REPORT   Patient Name:   Greg Fry Date of Exam: 01/30/2023 Medical Rec #:  [id number]   Height:       70.0 in Accession #:    [id number]  Weight:       237.4 lb Date of Birth:  [birth year]  BSA:          2.245 m Patient Age:    67 years    BP:           148/75 mmHg Patient Gender: M           HR:           64 bpm. Exam Location:  [facility] Procedure: 2D Echo, Cardiac Doppler, Color Doppler and Saline Contrast Bubble            Study Indications:    Stroke l63.9  History:        Patient has no prior history of Echocardiogram examinations.  Sonographer:    Aida Pizza RCS Referring Phys: [phone] EKTA V PATEL IMPRESSIONS  1. Left ventricular ejection fraction, by estimation, is 70 to 75%. The left ventricle has hyperdynamic function. The left ventricle has no regional wall motion abnormalities. There is moderate concentric left ventricular hypertrophy. Left ventricular diastolic parameters are  consistent with Grade I diastolic dysfunction (impaired relaxation).  2. Right  ventricular systolic function is normal. The right ventricular size is normal. Tricuspid regurgitation signal is inadequate for assessing PA pressure.  3. The mitral valve is grossly normal. Trivial mitral valve regurgitation.  4. The aortic valve is tricuspid. There is mild calcification of the aortic valve. Aortic valve regurgitation is mild.  5. Aortic dilatation noted. There is borderline dilatation of the aortic root, measuring 39 mm.  6. The inferior vena cava is normal in size with greater than 50% respiratory variability, suggesting right atrial pressure of 3 mmHg.  7. Agitated saline contrast bubble study was negative, with no evidence of any interatrial shunt. Comparison(s): No prior Echocardiogram. FINDINGS  Left Ventricle: Left ventricular ejection fraction, by estimation, is 70 to 75%. The left ventricle has hyperdynamic function. The left ventricle has no regional wall motion abnormalities. The left ventricular internal cavity size was normal in size. There is moderate concentric left ventricular hypertrophy. Left ventricular diastolic parameters are consistent with Grade I diastolic dysfunction (impaired relaxation). Right Ventricle: The right ventricular size is normal. No increase in right ventricular wall thickness. Right ventricular systolic function is normal. Tricuspid regurgitation signal is inadequate for assessing PA pressure. Left Atrium: Left atrial size was normal in size. Right Atrium: Right atrial size was normal in size. Pericardium: There is no evidence of pericardial effusion. Mitral Valve: The mitral valve is grossly normal. Trivial mitral valve regurgitation. Tricuspid Valve: The tricuspid valve is grossly normal. Tricuspid valve regurgitation is trivial. Aortic Valve: The aortic valve is tricuspid. There is mild calcification of the aortic valve. Aortic valve regurgitation is mild. Aortic regurgitation PHT measures 452 msec. Pulmonic Valve: The pulmonic valve was not well  visualized. Pulmonic valve regurgitation is trivial. Aorta: Aortic dilatation noted. There is borderline dilatation of the aortic root, measuring 39 mm. Venous: The inferior vena cava is normal in size with greater than 50% respiratory variability, suggesting right atrial pressure of 3 mmHg. IAS/Shunts: No atrial level shunt detected by color flow Doppler. Agitated saline contrast was given intravenously to evaluate for intracardiac shunting. Agitated saline contrast bubble study was negative, with no evidence of any interatrial shunt.  LEFT VENTRICLE PLAX 2D LVIDd:         4.50 cm   Diastology LVIDs:         2.10 cm   LV e' medial:    4.79 cm/s LV PW:         1.60 cm   LV E/e' medial:  15.6 LV IVS:        1.50 cm   LV e' lateral:   5.98 cm/s LVOT diam:     2.30 cm   LV E/e' lateral: 12.5 LV SV:         84 LV SV Index:   37 LVOT Area:     4.15 cm  RIGHT VENTRICLE RV S prime:     26.50 cm/s TAPSE (M-mode): 2.7 cm LEFT ATRIUM             Index LA diam:        5.00 cm 2.23 cm/m LA Vol (A2C):   47.9 ml 21.33 ml/m LA Vol (A4C):   46.3 ml 20.62 ml/m LA Biplane Vol: 49.4 ml 22.00 ml/m  AORTIC VALVE LVOT Vmax:   112.00 cm/s LVOT Vmean:  73.500 cm/s LVOT VTI:    0.201 m AI PHT:      452 msec  AORTA Ao Root diam: 3.90 cm  MITRAL VALVE MV Area (PHT): 2.45 cm     SHUNTS MV Decel Time: 310 msec     Systemic VTI:  0.20 m MV E velocity: 74.70 cm/s   Systemic Diam: 2.30 cm MV A velocity: 109.00 cm/s MV E/A ratio:  0.69 Jayson Sierras MD Electronically signed by Jayson Sierras MD Signature Date/Time: 01/30/2023/3:18:25 PM    Final    MR BRAIN WO CONTRAST Result Date: 01/29/2023 CLINICAL DATA:  Blurred vision and dizziness beginning yesterday. The examination had to be discontinued prior to completion due to severe claustrophobia. EXAM: MRI HEAD WITHOUT CONTRAST TECHNIQUE: Multiplanar, multiecho pulse sequences of the brain and surrounding structures were obtained without intravenous contrast. COMPARISON:  CT head and  cervical spine without contrast 12/16/2013. FINDINGS: Brain: Diffusion-weighted images demonstrate a punctate focus of restricted diffusion in the right paramedian posterior pons. No other restricted diffusion is present. A heterogeneous lesion in the left parotid gland measures 15 mm. A left parotid lesion was present the cervical spine CT scan of 2015 in retrospect. Other focal lesions are present. IMPRESSION: 1. Punctate focus of restricted diffusion in the right paramedian posterior pons compatible with a punctate acute infarct. This may involve the pathway of the fourth cranial nerve or the sixth nerve nucleus corresponding to the abnormal eye movements. 2. 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 A left parotid lesion was present the cervical spine CT scan of 2015 in retrospect. Recommend non emergent CT of the neck with contrast and ENT referral. These results were called by telephone at the time of interpretation on 01/29/2023 at 6:24 pm to provider Dr. Redell Pinal, who verbally acknowledged these results. Electronically Signed   By: Lonni Necessary M.D.   On: 01/29/2023 18:24       Elgie Butter M.D. [facility] 01/30/2023, 3:42 PM  Available via Epic secure chat 7am-7pm After 7 pm, please refer to night coverage provider listed on amion.

## 2023-01-30 NOTE — Progress Notes (Signed)
   01/30/23 1444  TOC Brief Assessment  Insurance and Status Reviewed  Home environment has been reviewed Single Family Home  Prior level of function: Independent  Prior/Current Home Services No current home services  Social Drivers of Health Review SDOH reviewed no interventions necessary  Readmission risk has been reviewed Yes  Transition of care needs no transition of care needs at this time      Transition of Care Department [facility]) has reviewed patient and no TOC needs have been identified at this time. We will continue to monitor patient advancement through interdisciplinary progression rounds. If new patient transition needs arise, please place a TOC consult.

## 2023-01-30 NOTE — Progress Notes (Signed)
 Nutrition Education Note  RD consulted for assessment of nutrition requirements/status. No nutrition concerns revealed on admission nutrition screen. Weight hx reviewed. No recent weight changes noted. Chart reviewed. No acute nutrition issues identified.   Patient Active Problem List   Diagnosis Date Noted   Acute cerebral infarction (HCC) 01/29/2023   Nystagmus 01/29/2023   History of cocaine abuse (HCC) 01/29/2023   Elevated blood pressure reading without diagnosis of hypertension 01/29/2023   Diplopia 01/29/2023   Low TSH level 09/29/2022   Thoracic spine fracture (HCC) 02/13/2014   Past Medical History:  Diagnosis Date   Dislocated shoulder     Lipid Panel     Component Value Date/Time   CHOL 150 01/29/2023 1659   CHOL 142 07/07/2022 1543   TRIG 162 (H) 01/29/2023 1659   HDL 39 (L) 01/29/2023 1659   HDL 47 07/07/2022 1543   CHOLHDL 3.8 01/29/2023 1659   VLDL 32 01/29/2023 1659   LDLCALC 79 01/29/2023 1659   LDLCALC 74 07/07/2022 1543    RD attached Heart Healthy Nutrition Therapy handout from the [employer] to discharge instructions.   Body mass index is 34.07 kg/m. Pt meets criteria for obesity based on current BMI.  Current diet order is heart healthy. Labs and medications reviewed. No further nutrition interventions warranted at this time. RD contact information provided. If additional nutrition issues arise, please re-consult RD.   Suzen HUNT RD, LDN, CNSC Contact Inpatient RD using Secure Chat. If unavailable, use group chat RD Inpatient via Secure Chat in EPIC.

## 2023-01-31 ENCOUNTER — Inpatient Hospital Stay (HOSPITAL_COMMUNITY): Payer: Medicare Other

## 2023-01-31 ENCOUNTER — Encounter: Payer: Self-pay | Admitting: Internal Medicine

## 2023-01-31 DIAGNOSIS — I6350 Cerebral infarction due to unspecified occlusion or stenosis of unspecified cerebral artery: Secondary | ICD-10-CM

## 2023-01-31 DIAGNOSIS — H5500 Unspecified nystagmus: Secondary | ICD-10-CM | POA: Diagnosis not present

## 2023-01-31 DIAGNOSIS — I639 Cerebral infarction, unspecified: Secondary | ICD-10-CM | POA: Diagnosis not present

## 2023-01-31 DIAGNOSIS — R030 Elevated blood-pressure reading, without diagnosis of hypertension: Secondary | ICD-10-CM

## 2023-01-31 DIAGNOSIS — F1411 Cocaine abuse, in remission: Secondary | ICD-10-CM | POA: Diagnosis not present

## 2023-01-31 LAB — LIPID PANEL
Cholesterol: 110 mg/dL (ref 0–200)
HDL: 34 mg/dL — ABNORMAL LOW (ref >40)
LDL Cholesterol: 51 mg/dL (ref 0–99)
Total CHOL/HDL Ratio: 3.2 {ratio}
Triglycerides: 124 mg/dL (ref <150)
VLDL: 25 mg/dL (ref 0–40)

## 2023-01-31 LAB — BASIC METABOLIC PANEL
Anion gap: 12 (ref 5–15)
BUN: 13 mg/dL (ref 8–23)
CO2: 19 mmol/L — ABNORMAL LOW (ref 22–32)
Calcium: 9.1 mg/dL (ref 8.9–10.3)
Chloride: 109 mmol/L (ref 98–111)
Creatinine, Ser: 0.89 mg/dL (ref 0.61–1.24)
GFR, Estimated: >60 mL/min (ref >60)
Glucose, Bld: 99 mg/dL (ref 70–99)
Potassium: 3.8 mmol/L (ref 3.5–5.1)
Sodium: 140 mmol/L (ref 135–145)

## 2023-01-31 LAB — HEMOGLOBIN A1C
Hgb A1c MFr Bld: 5.8 % — ABNORMAL HIGH (ref 4.8–5.6)
Mean Plasma Glucose: 119.76 mg/dL

## 2023-01-31 MED ORDER — ATORVASTATIN CALCIUM 40 MG PO TABS
40.0000 mg | ORAL_TABLET | Freq: Once | ORAL | Status: AC
  Administered 2023-01-31: 40 mg via ORAL
  Filled 2023-01-31: qty 1

## 2023-01-31 MED ORDER — CLOPIDOGREL BISULFATE 75 MG PO TABS: 75.0000 mg | ORAL_TABLET | Freq: Every day | ORAL | 0 refills | Status: AC

## 2023-01-31 MED ORDER — AMLODIPINE BESYLATE 5 MG PO TABS: 5.0000 mg | ORAL_TABLET | Freq: Every day | ORAL | 0 refills | Status: DC

## 2023-01-31 MED ORDER — ATORVASTATIN CALCIUM 40 MG PO TABS: 80.0000 mg | ORAL_TABLET | Freq: Every day | ORAL | Status: DC

## 2023-01-31 MED ORDER — AMLODIPINE BESYLATE 5 MG PO TABS
5.0000 mg | ORAL_TABLET | Freq: Every day | ORAL | Status: DC
  Administered 2023-01-31: 5 mg via ORAL
  Filled 2023-01-31: qty 1

## 2023-01-31 MED ORDER — IOHEXOL 350 MG/ML SOLN
75.0000 mL | Freq: Once | INTRAVENOUS | Status: AC | PRN
  Administered 2023-01-31: 75 mL via INTRAVENOUS

## 2023-01-31 NOTE — TOC Transition Note (Signed)
 Transition of Care [facility]) - Discharge Note   Patient Details  Name: Greg Fry MRN: [id number] Date of Birth: 1955/12/17  [facility] CM/SW Contact:  Noreen KATHEE Cleotilde ISRAEL Phone Number: 01/31/2023, 2:17 PM   Clinical Narrative:    Patient is discharging today.CSW spoke with patient regarding consult for substance use education/ counseling. CSW spoke with patient. Pt shared that he has been talking to people and was thankful that CSW is adding additional information to help with his usage. Education/ counseling added to AVS. TOC signing off . No other identified needs at this time .   Final next level of care: Home/Self Care Barriers to Discharge: Barriers Resolved   Patient Goals and CMS Choice Patient states their goals for this hospitalization and ongoing recovery are:: return home CMS Medicare.gov Compare Post Acute Care list provided to:: Patient Choice offered to / list presented to : Patient      Discharge Placement                Patient to be transferred to facility by: family/friend Name of family member notified: Spoke with patient Patient and family notified of of transfer: 01/31/23  Discharge Plan and Services Additional resources added to the After Visit Summary for       Social Drivers of Health (SDOH) Interventions SDOH Screenings   Food Insecurity: No Food Insecurity (01/29/2023)  Housing: Low Risk  (01/29/2023)  Transportation Needs: No Transportation Needs (01/29/2023)  Utilities: Not At Risk (01/29/2023)  Depression (PHQ2-9): High Risk (07/07/2022)  Social Connections: Moderately Integrated (01/29/2023)  Tobacco Use: Medium Risk (01/29/2023)     Readmission Risk Interventions    01/31/2023    2:15 PM 01/30/2023    2:44 PM  Readmission Risk Prevention Plan  Medication Screening Complete Complete  Transportation Screening Complete Complete

## 2023-01-31 NOTE — Consult Note (Addendum)
 I connected with  Alm Molt on 01/31/23 by a video enabled telemedicine application and verified that I am speaking with the correct person using two identifiers.   I discussed the limitations of evaluation and management by telemedicine. The patient expressed understanding and agreed to proceed.  Location of patient: [facility] Location of physician: [facility]   Neurology Consultation Reason for Consult: stroke Referring Physician: Dr Mario Blanch  CC: dizziness, double vision  History is obtained from:  patient, chart review  HPI: Greg Fry is a 68 y.o. male with history of cocaine use (UDS positive during this admission as well) possible hypertension, nicotine use disorder who presented with double vision as well as dizziness.  Patient states he woke up around 7 AM on Saturday and his vision was normal.  He then went back to sleep and woke up around 8:30 AM and noticed that he was seeing double when both eyes were open and his left eye felt different.  He also felt off balance when walking.  He waited but when the symptoms did not improve, he talked to his family who recommended he take an aspirin  and come to the hospital.  States he does not take any antiplatelets or anticoagulants  Last known normal: 7 am on Saturday 01/27/2022 Event happened at home No tPA as outside window No thrombectomy as outside window mRS 0   ROS: All other systems reviewed and negative except as noted in the HPI.   Past Medical History:  Diagnosis Date   Dislocated shoulder    Social History:  reports that he has quit smoking. His smoking use included cigarettes. He does not have any smokeless tobacco history on file. He reports current alcohol use. He reports that he does not currently use drugs after having used the following drugs: Cocaine and Marijuana.   Medications Prior to Admission  Medication Sig Dispense Refill Last Dose/Taking   aspirin  EC 81 MG tablet Take 81 mg by mouth every 6 (six)  hours as needed (high blood pressure). Swallow whole.   01/29/2023 at 12:00 PM      Exam: Current vital signs: BP 128/67 (BP Location: Right Arm)   Pulse 69   Temp 97.8 F (36.6 C) (Oral)   Resp 19   Ht 5' 10 (1.778 m)   Wt 107.7 kg   SpO2 95%   BMI 34.07 kg/m  Vital signs in last 24 hours: Temp:  [97.2 F (36.2 C)-98.8 F (37.1 C)] 97.8 F (36.6 C) (01/14 0500) Pulse Rate:  [62-76] 69 (01/14 0500) Resp:  [19-21] 19 (01/13 2244) BP: (128-180)/(67-94) 128/67 (01/14 0500) SpO2:  [94 %-95 %] 95 % (01/14 0500)   Physical Exam  Constitutional: Appears well-developed and well-nourished.  Psych: Affect appropriate to situation Neuro: AOx3, left eye  down and out as well as ptosis (third nerve palsy), difficult to appreciate any difference in pupillary reactivity via tele, right facial droop, rest of the cranial nerves appear grossly intact, antigravity strength in upper extremities without drift, sensory intact to light touch, FTN intact bilaterally  NIHSS 3  INPUTS: 1A: Level of consciousness --> 0 = Alert; keenly responsive 1B: Ask month and age --> 0 = Both questions right 1C: 'Blink eyes' & 'squeeze hands' --> 0 = Performs both tasks 2: Horizontal extraocular movements --> 1 = Partial gaze palsy: can be overcome 3: Visual fields --> 0 = No visual loss 4: Facial palsy --> 2 = Partial paralysis (lower face) 5A: Left arm motor drift -->  0 = No drift for 10 seconds 5B: Right arm motor drift --> 0 = No drift for 10 seconds 6A: Left leg motor drift --> 0 = No drift for 5 seconds 6B: Right leg motor drift --> 0 = No drift for 5 seconds 7: Limb Ataxia --> 0 = No ataxia 8: Sensation --> 0 = Normal; no sensory loss 9: Language/aphasia --> 0 = Normal; no aphasia 10: Dysarthria --> 0 = Normal 11: Extinction/inattention --> 0 = No abnormality   I have reviewed labs in epic and the results pertinent to this consultation are: CBC:  Recent Labs  Lab 01/29/23 1659 01/29/23 1736   WBC 8.9  --   NEUTROABS 6.2  --   HGB 16.3 17.0  HCT 47.8 50.0  MCV 91.2  --   PLT 247  --     Basic Metabolic Panel:  Lab Results  Component Value Date   NA 139 01/29/2023   K 4.1 01/29/2023   CO2 21 (L) 01/29/2023   GLUCOSE 116 (H) 01/29/2023   BUN 10 01/29/2023   CREATININE 0.90 01/29/2023   CALCIUM  9.0 01/29/2023   GFRNONAA >60 01/29/2023   GFRAA >90 12/16/2013   Lipid Panel:  Lab Results  Component Value Date   LDLCALC 51 01/31/2023   HgbA1c:  Lab Results  Component Value Date   HGBA1C 5.7 12/22/2022   Urine Drug Screen:     Component Value Date/Time   LABOPIA NONE DETECTED 01/29/2023 1643   COCAINSCRNUR POSITIVE (A) 01/29/2023 1643   LABBENZ POSITIVE (A) 01/29/2023 1643   AMPHETMU NONE DETECTED 01/29/2023 1643   THCU NONE DETECTED 01/29/2023 1643   LABBARB NONE DETECTED 01/29/2023 1643    Alcohol Level     Component Value Date/Time   ETH <10 01/29/2023 1659     I have reviewed the images obtained:  MRI Brain wo contrast 01/29/2023: Punctate focus of restricted diffusion in the right paramedian posterior pons compatible with a punctate acute infarct. This may involve the pathway of the fourth cranial nerve or the sixth nerve nucleus corresponding to the abnormal eye movements.    ASSESSMENT/PLAN: 68 year old male who presented with dizziness and diplopia.  MRI brain showed pontine stroke as described above.  Acute ischemic stroke Etiology: Likely small vessel disease versus vasospasm due to cocaine use  Recommendations: -Patient is claustrophobic.  Therefore ordered CTA head and neck -Recommend aspirin  81 mg daily -Depending on the CTA findings, if there is symptomatic intracranial stenosis, would recommend Plavix  75 mg daily for 3 months.  If no symptomatic intracranial stenosis, would recommend Plavix  for 3 weeks -LDL is 51 therefore not starting statin -TTE did not any thrombus.  Therefore recommend cardiac monitor to look for paroxysmal  A-fib -Counseled cocaine use and nicotine use -PT/OT -No driving due to diplopia unless cleared -Management of stroke risk factors Stroke education -Follow-up with neurology in 3 months (order placed) -Plan discussed with patient and sister at bedside - Plan discussed with Dr.Akula via secure chat  CTA reviewed: recommend plavix  75mg  for 3 weeks. Dr Cherlyn notified.   Thank you for allowing us  to participate in the care of this patient. If you have any further questions, please contact  me or neurohospitalist.   Arlin Krebs Epile[facility]

## 2023-01-31 NOTE — Discharge Summary (Addendum)
 Physician Discharge Summary   Patient: Greg Fry MRN: [id number] DOB: 12-09-1955  Admit date:     01/29/2023  Discharge date: 01/31/2023  Discharge Physician: Elgie Butter   PCP: Oley Bascom RAMAN, NP   Recommendations at discharge:  Please follow up with PCP in one week.  Please follow up with Neurology as scheduled Please follow up with ophthalmology in one week.  Please  follow up with ENT as outpatient for the left parotid lesion.  Please follow up with cardiology for event monitor placement. Message sent to cardiology.   Discharge Diagnoses: Principal Problem:   Nystagmus Active Problems:   Low TSH level   Acute cerebral infarction (HCC)   History of cocaine abuse (HCC)   Elevated blood pressure reading without diagnosis of hypertension   Diplopia    Hospital Course: Greg Fry is a 68 y.o. male with past medical history  of cocaine use, allergies to penicillin, ? Lazy eye, elevated blood pressures-question whitecoat syndrome, history of tobacco abuse, presenting today with double vision that he noticed today morning and resolved with closing either of the eyes.  He could not walk because he felt off balance  He was admitted for evaluation of stroke.   Assessment and Plan:    * Nystagmus/ Acute cerebral Infarction: 2/2 to acute stroke , MRI brain shos Punctate focus of restricted diffusion in the right paramedian posterior pons compatible with a punctate acute infarct. Started him on aspirin  81 mg daily, plavix  75 mg daily  for 3 weeks followed by aspirin  81 mg daily alone.  Echocardiogram showed preserved LVEF.  Lipid panel is pending.  and A1c is  5.7%.  Therapy evaluation ordered and recommendations given.  Neurology consulted and recommendations given.    History of cocaine abuse (HCC) UDS is positive for cocaine and benzos.  Toc for resources.      Elevated free t4 TSH wnl.  Recommend checking thyroid  panel in 4 weeks.      18 mm heterogeneous lesion  in the left parotid gland  Discussed with the patient.  Recommend outpatient follow up with ENT.      Hypertension Well controlled. Added Low dose norvasc .       Consultants: neurology Procedures performed: CT angi of the head ane neck MRI brain.   Disposition: Home Diet recommendation:  Discharge Diet Orders (From admission, onward)     Start     Ordered   01/31/23 0000  Diet - low sodium heart healthy        01/31/23 1355           Regular diet DISCHARGE MEDICATION: Allergies as of 01/31/2023       Reactions   Penicillins Other (See Comments)   Child hood reaction        Medication List     TAKE these medications    amLODipine  5 MG tablet Commonly known as: NORVASC  Take 1 tablet (5 mg total) by mouth daily. Start taking on: February 01, 2023   aspirin  EC 81 MG tablet Take 81 mg by mouth 1 tab daily. Swallow whole.   clopidogrel  75 MG tablet Commonly known as: PLAVIX  Take 1 tablet (75 mg total) by mouth daily for 21 days. Start taking on: February 01, 2023        Discharge Exam: Greg Fry   01/29/23 1641 01/29/23 2257  Weight: 104.3 kg 107.7 kg   General exam: Appears calm and comfortable  Respiratory system: Clear to auscultation. Respiratory effort normal. Cardiovascular system: S1 &  S2 heard, RRR. No JVD,  No pedal edema. Gastrointestinal system: Abdomen is nondistended, soft and nontender.  Central nervous system: Alert and oriented. Nystagmus present.  Extremities: Symmetric 5 x 5 power. Skin: No rashes, lesions or ulcers Psychiatry:  Mood & affect appropriate.    Condition at discharge: fair  The results of significant diagnostics from this hospitalization (including imaging, microbiology, ancillary and laboratory) are listed below for reference.   Imaging Studies: US  Carotid Bilateral Result Date: 01/31/2023 CLINICAL DATA:  Hypertension, stroke, hyperlipidemia, history of tobacco abuse EXAM: BILATERAL CAROTID DUPLEX ULTRASOUND  TECHNIQUE: Greg Fry imaging, color Doppler and duplex ultrasound were performed of bilateral carotid and vertebral arteries in the neck. COMPARISON:  None Available. FINDINGS: Criteria: Quantification of carotid stenosis is based on velocity parameters that correlate the residual internal carotid diameter with NASCET-based stenosis levels, using the diameter of the distal internal carotid lumen as the denominator for stenosis measurement. The following velocity measurements were obtained: RIGHT ICA: 87/7 cm/sec CCA: 109/12 cm/sec SYSTOLIC ICA/CCA RATIO: 0.9 ECA: 13 cm/sec LEFT ICA: 90/9 cm/sec CCA: 94/18 cm/sec SYSTOLIC ICA/CCA RATIO:  0.9 ECA: 94/18 cm/sec RIGHT CAROTID ARTERY: Eccentric calcified plaque in the distal common carotid artery. Heavily calcified plaque in the carotid bulb, proximal ICA, and proximal ECA resulting in significant areas of distal acoustic shadowing, limiting assessment. Tortuous distal ICA. RIGHT VERTEBRAL ARTERY:  Normal flow direction and waveform. LEFT CAROTID ARTERY: Calcified plaque in the distal common carotid artery and bulb. 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 LEFT VERTEBRAL ARTERY:  Limited waveform assessment. IMPRESSION: 1. Heavily calcified plaque in the bilateral carotid bulb and proximal ICA, resulting in significant areas of distal acoustic shadowing, limiting assessment. 2. 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 Carotid CTA or MRA may be useful for further characterization. Electronically Signed   By: JONETTA Faes M.D.   On: 01/31/2023 14:03   CT ANGIO HEAD NECK W WO CM Result Date: 01/31/2023 CLINICAL DATA:  Follow-up stroke.  Dizziness. EXAM: CT ANGIOGRAPHY HEAD AND NECK WITH AND WITHOUT CONTRAST TECHNIQUE: Multidetector CT imaging of the head and neck was performed using the standard protocol during bolus administration of intravenous contrast. Multiplanar CT image reconstructions and MIPs were obtained to evaluate the vascular  anatomy. Carotid stenosis measurements (when applicable) are obtained utilizing NASCET criteria, using the distal internal carotid diameter as the denominator. RADIATION DOSE REDUCTION: This exam was performed according to the departmental dose-optimization program which includes automated exposure control, adjustment of the mA and/or kV according to patient size and/or use of iterative reconstruction technique. CONTRAST:  75mL OMNIPAQUE  IOHEXOL  350 MG/ML SOLN COMPARISON:  MRI 01/29/2023 FINDINGS: CT HEAD FINDINGS Brain: No focal abnormality seen affecting the brainstem or cerebellum. Old infarctions in both thalami. Chronic small-vessel ischemic changes of the cerebral hemispheric white matter. No sign of acute infarction, mass lesion, hemorrhage, hydrocephalus or extra-axial collection. Vascular: There is atherosclerotic calcification of the major vessels at the base of the brain. Skull: Negative Sinuses/Orbits: Clear/normal Other: None Review of the MIP images confirms the above findings CTA NECK FINDINGS Aortic arch: Aortic tortuosity.  Branching pattern is normal. Right carotid system: Common carotid artery widely patent to the bifurcation. Dense calcified plaque at the carotid bifurcation and ICA bulb. Minimal diameter is 3 mm. Compared to a more distal cervical ICA diameter of 4 mm, this indicates a 25% stenosis. Left carotid system: Common carotid artery widely patent to the bifurcation. Dense calcified plaque at the carotid bifurcation and ICA bulb.  Plaque is irregular and is difficult to precisely measure the minimal luminal diameter. Based on the coronal imaging, this is estimated at 3 mm. Compared to a more distal cervical ICA diameter of 4 mm, this indicates a 25% stenosis. Vertebral arteries: No proximal subclavian stenosis. Both vertebral artery origins are widely patent. Both vertebral arteries appear normal through the cervical region to the foramen magnum. Skeleton: Ordinary chronic cervical  spondylosis. Other neck: Thyroid  goiter with intrathoracic extension. 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 See previous recommendations for ENT referral. Benign appearing lipoma of the posterior neck at the craniocervical junction. Upper chest: Lung apices are clear. Review of the MIP images confirms the above findings CTA HEAD FINDINGS Anterior circulation: Both internal carotid arteries are patent through the skull base and siphon regions. The anterior and middle cerebral vessels are patent. No large vessel occlusion or proximal stenosis. No aneurysm or vascular malformation. Posterior circulation: Both vertebral arteries widely patent through the foramen magnum to the basilar artery. No basilar stenosis. Posterior circulation branch vessels are patent. Venous sinuses: Patent and normal. Anatomic variants: None significant. Review of the MIP images confirms the above findings IMPRESSION: 1. No acute head CT finding. Old infarctions in both thalami. Chronic small-vessel ischemic changes of the cerebral hemispheric white matter. 2. Calcified plaque at both carotid bifurcations and ICA bulbs. 25% stenosis on each side. 3. No intracranial large vessel occlusion or proximal stenosis. 4. Thyroid  goiter with intrathoracic extension. 5. 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 See previous recommendations for ENT referral. 6. Benign appearing lipoma of the posterior neck at the craniocervical junction. Electronically Signed   By: Greg Fry M.D.   On: 01/31/2023 12:35   ECHOCARDIOGRAM COMPLETE BUBBLE STUDY Result Date: 01/30/2023    ECHOCARDIOGRAM REPORT   Patient Name:   Greg Fry Date of Exam: 01/30/2023 Medical Rec #:  [id number]   Height:       70.0 in Accession #:    [id number]  Weight:       237.4 lb Date of Birth:  [birth year]  BSA:          2.245 m Patient Age:    67 years    BP:            148/75 mmHg Patient Gender: M           HR:           64 bpm. Exam Location:  [facility] Procedure: 2D Echo, Cardiac Doppler, Color Doppler and Saline Contrast Bubble            Study Indications:    Stroke l63.9  History:        Patient has no prior history of Echocardiogram examinations.  Sonographer:    Aida Pizza RCS Referring Phys: [phone] EKTA V PATEL IMPRESSIONS  1. Left ventricular ejection fraction, by estimation, is 70 to 75%. The left ventricle has hyperdynamic function. The left ventricle has no regional wall motion abnormalities. There is moderate concentric left ventricular hypertrophy. Left ventricular diastolic parameters are consistent with Grade I diastolic dysfunction (impaired relaxation).  2. Right ventricular systolic function is normal. The right ventricular size is normal. Tricuspid regurgitation signal is inadequate for assessing PA pressure.  3. The mitral valve is grossly normal. Trivial mitral valve regurgitation.  4. The aortic valve is tricuspid. There is mild calcification of the aortic valve. Aortic valve regurgitation is mild.  5.  Aortic dilatation noted. There is borderline dilatation of the aortic root, measuring 39 mm.  6. The inferior vena cava is normal in size with greater than 50% respiratory variability, suggesting right atrial pressure of 3 mmHg.  7. Agitated saline contrast bubble study was negative, with no evidence of any interatrial shunt. Comparison(s): No prior Echocardiogram. FINDINGS  Left Ventricle: Left ventricular ejection fraction, by estimation, is 70 to 75%. The left ventricle has hyperdynamic function. The left ventricle has no regional wall motion abnormalities. The left ventricular internal cavity size was normal in size. There is moderate concentric left ventricular hypertrophy. Left ventricular diastolic parameters are consistent with Grade I diastolic dysfunction (impaired relaxation). Right Ventricle: The right ventricular size is normal. No increase  in right ventricular wall thickness. Right ventricular systolic function is normal. Tricuspid regurgitation signal is inadequate for assessing PA pressure. Left Atrium: Left atrial size was normal in size. Right Atrium: Right atrial size was normal in size. Pericardium: There is no evidence of pericardial effusion. Mitral Valve: The mitral valve is grossly normal. Trivial mitral valve regurgitation. Tricuspid Valve: The tricuspid valve is grossly normal. Tricuspid valve regurgitation is trivial. Aortic Valve: The aortic valve is tricuspid. There is mild calcification of the aortic valve. Aortic valve regurgitation is mild. Aortic regurgitation PHT measures 452 msec. Pulmonic Valve: The pulmonic valve was not well visualized. Pulmonic valve regurgitation is trivial. Aorta: Aortic dilatation noted. There is borderline dilatation of the aortic root, measuring 39 mm. Venous: The inferior vena cava is normal in size with greater than 50% respiratory variability, suggesting right atrial pressure of 3 mmHg. IAS/Shunts: No atrial level shunt detected by color flow Doppler. Agitated saline contrast was given intravenously to evaluate for intracardiac shunting. Agitated saline contrast bubble study was negative, with no evidence of any interatrial shunt.  LEFT VENTRICLE PLAX 2D LVIDd:         4.50 cm   Diastology LVIDs:         2.10 cm   LV e' medial:    4.79 cm/s LV PW:         1.60 cm   LV E/e' medial:  15.6 LV IVS:        1.50 cm   LV e' lateral:   5.98 cm/s LVOT diam:     2.30 cm   LV E/e' lateral: 12.5 LV SV:         84 LV SV Index:   37 LVOT Area:     4.15 cm  RIGHT VENTRICLE RV S prime:     26.50 cm/s TAPSE (M-mode): 2.7 cm LEFT ATRIUM             Index LA diam:        5.00 cm 2.23 cm/m LA Vol (A2C):   47.9 ml 21.33 ml/m LA Vol (A4C):   46.3 ml 20.62 ml/m LA Biplane Vol: 49.4 ml 22.00 ml/m  AORTIC VALVE LVOT Vmax:   112.00 cm/s LVOT Vmean:  73.500 cm/s LVOT VTI:    0.201 m AI PHT:      452 msec  AORTA Ao Root  diam: 3.90 cm MITRAL VALVE MV Area (PHT): 2.45 cm     SHUNTS MV Decel Time: 310 msec     Systemic VTI:  0.20 m MV E velocity: 74.70 cm/s   Systemic Diam: 2.30 cm MV A velocity: 109.00 cm/s MV E/A ratio:  0.69 Greg Sierras MD Electronically signed by Greg Sierras MD Signature Date/Time: 01/30/2023/3:18:25 PM    Final  MR BRAIN WO CONTRAST Result Date: 01/29/2023 CLINICAL DATA:  Blurred vision and dizziness beginning yesterday. The examination had to be discontinued prior to completion due to severe claustrophobia. EXAM: MRI HEAD WITHOUT CONTRAST TECHNIQUE: Multiplanar, multiecho pulse sequences of the brain and surrounding structures were obtained without intravenous contrast. COMPARISON:  CT head and cervical spine without contrast 12/16/2013. FINDINGS: Brain: Diffusion-weighted images demonstrate a punctate focus of restricted diffusion in the right paramedian posterior pons. No other restricted diffusion is present. A heterogeneous lesion in the left parotid gland measures 15 mm. A left parotid lesion was present the cervical spine CT scan of 2015 in retrospect. Other focal lesions are present. IMPRESSION: 1. Punctate focus of restricted diffusion in the right paramedian posterior pons compatible with a punctate acute infarct. This may involve the pathway of the fourth cranial nerve or the sixth nerve nucleus corresponding to the abnormal eye movements. 2. 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 A left parotid lesion was present the cervical spine CT scan of 2015 in retrospect. Recommend non emergent CT of the neck with contrast and ENT referral. These results were called by telephone at the time of interpretation on 01/29/2023 at 6:24 pm to provider Dr. Redell Pinal, who verbally acknowledged these results. Electronically Signed   By: Greg Fry M.D.   On: 01/29/2023 18:24    Microbiology: No results found for this or any previous visit.  Labs: CBC: Recent Labs  Lab  01/29/23 1659 01/29/23 1736  WBC 8.9  --   NEUTROABS 6.2  --   HGB 16.3 17.0  HCT 47.8 50.0  MCV 91.2  --   PLT 247  --    Basic Metabolic Panel: Recent Labs  Lab 01/29/23 1659 01/29/23 1736 01/31/23 0313  NA 134* 139 140  K 4.0 4.1 3.8  CL 105 105 109  CO2 21*  --  19*  GLUCOSE 117* 116* 99  BUN 11 10 13   CREATININE 0.84 0.90 0.89  CALCIUM  9.0  --  9.1   Liver Function Tests: Recent Labs  Lab 01/29/23 1659  AST 20  ALT 32  ALKPHOS 149*  BILITOT 0.8  PROT 7.3  ALBUMIN 3.7   CBG: Recent Labs  Lab 01/29/23 1639  GLUCAP 137*    Discharge time spent: 40 minutes.   Signed: Elgie Butter, MD [facility] 01/31/2023   Addendum:  [place] Mr Husby again later today to make sure he  takes aspirin  81 mg daily and not every 6 hours. He confirmed the dose and to take only one tablet daily and not every 6 hours.  Greg Heidel, MD

## 2023-02-01 ENCOUNTER — Telehealth: Payer: Self-pay

## 2023-02-01 ENCOUNTER — Encounter: Payer: Self-pay | Admitting: *Deleted

## 2023-02-01 ENCOUNTER — Other Ambulatory Visit: Payer: Self-pay | Admitting: Cardiology

## 2023-02-01 ENCOUNTER — Telehealth: Payer: Self-pay | Admitting: Cardiology

## 2023-02-01 DIAGNOSIS — I493 Ventricular premature depolarization: Secondary | ICD-10-CM

## 2023-02-01 DIAGNOSIS — I639 Cerebral infarction, unspecified: Secondary | ICD-10-CM

## 2023-02-01 DIAGNOSIS — I444 Left anterior fascicular block: Secondary | ICD-10-CM

## 2023-02-01 NOTE — Transitions of Care (Post Inpatient/ED Visit) (Signed)
   02/01/2023  Name: Greg Fry MRN: [id number] DOB: [birth year]  Today's TOC FU Call Status: Today's TOC FU Call Status:: Successful TOC FU Call Completed TOC FU Call Complete Date: 02/01/23 Patient's Name and Date of Birth confirmed.  Transition Care Management Follow-up Telephone Call Date of Discharge: 01/31/23 Discharge Facility: [facility] ([facility] Type of Discharge: Inpatient Admission Primary Inpatient Discharge Diagnosis:: cerebral infarction How have you been since you were released from the hospital?: Better Any questions or concerns?: No  Items Reviewed: Did you receive and understand the discharge instructions provided?: Yes Medications obtained,verified, and reconciled?: Yes (Medications Reviewed) Any new allergies since your discharge?: No Dietary orders reviewed?: Yes Do you have support at home?: Yes People in Home: significant other  Medications Reviewed Today: Medications Reviewed Today     Reviewed by Darrall Ellison, LPN (Licensed Practical Nurse) on 02/01/23 at [phone]  Med List Status: <None>   Medication Order Taking? Sig Documenting Provider Last Dose Status Informant  amLODipine  (NORVASC ) 5 MG tablet [id number]  Take 1 tablet (5 mg total) by mouth daily. Feliciana Horn, MD  Active   aspirin  EC 81 MG tablet [id number] No Take 81 mg by mouth every 6 (six) hours as needed (high blood pressure). Swallow whole. [provider] 01/29/2023 12:00 PM Active Self  clopidogrel  (PLAVIX ) 75 MG tablet [id number]  Take 1 tablet (75 mg total) by mouth daily for 21 days. Feliciana Horn, MD  Active             Home Care and Equipment/Supplies: Were Home Health Services Ordered?: NA Any new equipment or medical supplies ordered?: NA  Functional Questionnaire: Do you need assistance with bathing/showering or dressing?: No Do you need assistance with meal preparation?: No Do you need assistance with eating?: No Do you have difficulty maintaining continence: No Do  you need assistance with getting out of bed/getting out of a chair/moving?: No Do you have difficulty managing or taking your medications?: No  Follow up appointments reviewed: PCP Follow-up appointment confirmed?: Yes Date of PCP follow-up appointment?: 02/08/23 Follow-up Provider: [facility] Follow-up appointment confirmed?: No Reason Specialist Follow-Up Not Confirmed: Patient has Specialist Provider Number and will Call for Appointment Do you need transportation to your follow-up appointment?: No Do you understand care options if your condition(s) worsen?: Yes-patient verbalized understanding    SIGNATURE Darrall Ellison, LPN [facility] Nurse Health Advisor Direct Dial [phone]

## 2023-02-01 NOTE — Telephone Encounter (Signed)
 Primary team has requested a 30-day monitor for stroke.  Please have Dr. Lorie Rook to be since he is Dr. Marvia Slocumb the day today.

## 2023-02-08 ENCOUNTER — Ambulatory Visit (INDEPENDENT_AMBULATORY_CARE_PROVIDER_SITE_OTHER): Payer: Medicare Other | Admitting: Nurse Practitioner

## 2023-02-08 ENCOUNTER — Encounter: Payer: Self-pay | Admitting: Nurse Practitioner

## 2023-02-08 VITALS — BP 137/80 | HR 87 | Temp 97.1°F | Wt 235.0 lb

## 2023-02-08 DIAGNOSIS — F1410 Cocaine abuse, uncomplicated: Secondary | ICD-10-CM

## 2023-02-08 DIAGNOSIS — I6523 Occlusion and stenosis of bilateral carotid arteries: Secondary | ICD-10-CM | POA: Diagnosis not present

## 2023-02-08 DIAGNOSIS — Z1329 Encounter for screening for other suspected endocrine disorder: Secondary | ICD-10-CM

## 2023-02-08 DIAGNOSIS — I639 Cerebral infarction, unspecified: Secondary | ICD-10-CM

## 2023-02-08 DIAGNOSIS — E0590 Thyrotoxicosis, unspecified without thyrotoxic crisis or storm: Secondary | ICD-10-CM

## 2023-02-08 MED ORDER — ASPIRIN 81 MG PO TBEC: 81.0000 mg | DELAYED_RELEASE_TABLET | Freq: Four times a day (QID) | ORAL | 2 refills | Status: AC | PRN

## 2023-02-08 MED ORDER — AMLODIPINE BESYLATE 5 MG PO TABS: 5.0000 mg | ORAL_TABLET | Freq: Every day | ORAL | 1 refills | Status: DC

## 2023-02-08 NOTE — Progress Notes (Signed)
Subjective   Patient ID: Greg Fry, male    DOB: Mar 01, 1955, 68 y.o.   MRN: [id number]  Chief Complaint  Patient presents with   Hospitalization Follow-up    Referring provider: [name], NP  [name] is a 68 y.o. male with Past Medical History: No date: Dislocated shoulder No date: Stroke [facility])   Recent significant events:  Hospital Admission: 01/29/23  Hospital summary:  Nystagmus/ Acute cerebral Infarction: 2/2 to acute stroke , MRI brain shos Punctate focus of restricted diffusion in the right paramedian posterior pons compatible with a punctate acute infarct. Started him on aspirin 81 mg daily, plavix 75 mg daily  for 3 weeks followed by aspirin 81 mg daily alone.  Echocardiogram showed preserved LVEF.  Lipid panel is pending.  and A1c is  5.7%.  Therapy evaluation ordered and recommendations given.  Neurology consulted and recommendations given.    History of cocaine abuse (HCC) UDS is positive for cocaine and benzos.  Toc for resources.      Elevated free t4 TSH wnl.  Recommend checking thyroid panel in 4 weeks.      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  Recommend outpatient follow up with ENT.      Hypertension Well controlled. Added Low dose norvasc.    HPI  Patient states that he is doing well since hospital discharge.  Please see notes above.  Patient has been seen by optometry due to blurred vision and will be getting glasses next week.  He does have a follow-up with neurology scheduled and also has a follow-up with ENT scheduled.  We will place a referral to cardiology for carotid artery calcifications. Denies f/c/s, n/v/d, hemoptysis, PND, leg swelling Denies chest pain or edema      Allergies  Allergen Reactions   Penicillins Other (See Comments)    Child hood reaction     There is no immunization history on file for this patient.  Tobacco History: Social History    Tobacco Use  Smoking Status Former   Types: Cigarettes  Smokeless Tobacco Not on file  Tobacco Comments   Stopped smoking 5 years ago.   Counseling given: Not Answered Tobacco comments: Stopped smoking 5 years ago.   Outpatient Encounter Medications as of 02/08/2023  Medication Sig   clopidogrel (PLAVIX) 75 MG tablet Take 1 tablet (75 mg total) by mouth daily for 21 days.   [DISCONTINUED] amLODipine (NORVASC) 5 MG tablet Take 1 tablet (5 mg total) by mouth daily.   [DISCONTINUED] aspirin EC 81 MG tablet Take 81 mg by mouth every 6 (six) hours as needed (high blood pressure). Swallow whole.   amLODipine (NORVASC) 5 MG tablet Take 1 tablet (5 mg total) by mouth daily.   aspirin EC 81 MG tablet Take 1 tablet (81 mg total) by mouth every 6 (six) hours as needed (high blood pressure). Swallow whole.   No facility-administered encounter medications on file as of 02/08/2023.    Review of Systems  Review of Systems  Constitutional: Negative.   HENT: Negative.    Cardiovascular: Negative.   Gastrointestinal: Negative.   Allergic/Immunologic: Negative.   Neurological: Negative.   Psychiatric/Behavioral: Negative.       Objective:   BP 137/80   Pulse 87   Temp (!) 97.1 F (36.2 C)   Wt 235 lb (106.6 kg)   SpO2 96%   BMI 33.72 kg/m   Wt Readings from Last 5 Encounters:  02/08/23 235 lb (106.6 kg)  01/29/23 237 lb 7 oz (107.7 kg)  12/22/22 235 lb (106.6 kg)  09/29/22 233 lb 9.6 oz (106 kg)  07/07/22 233 lb (105.7 kg)     Physical Exam Vitals and nursing note reviewed.  Constitutional:      General: He is not in acute distress.    Appearance: He is well-developed.  Cardiovascular:     Rate and Rhythm: Normal rate and regular rhythm.  Pulmonary:     Effort: Pulmonary effort is normal.     Breath sounds: Normal breath sounds.  Skin:    General: Skin is warm and dry.  Neurological:     Mental Status: He is alert and oriented to person, place, and time.        Assessment & Plan:   Calcification of both carotid arteries -     Ambulatory referral to Cardiology  Cocaine abuse (HCC) -     AMB Referral [facility]BCI Care Management -     Drug Screen 11 w/Conf, Ser -     Drug Screen 11 w/Conf, Ser  Thyroid disorder screen -     Thyroid Panel With TSH  Acute cerebral infarction (HCC) -     CBC -     Comprehensive metabolic panel  Other orders -     amLODIPine Besylate; Take 1 tablet (5 mg total) by mouth daily.  Dispense: 90 tablet; Refill: 1 -     Aspirin; Take 1 tablet (81 mg total) by mouth every 6 (six) hours as needed (high blood pressure). Swallow whole.  Dispense: 90 tablet; Refill: 2     Return if symptoms worsen or fail to improve.   [name], NP 02/08/2023

## 2023-02-08 NOTE — Patient Instructions (Addendum)
1. Calcification of both carotid arteries (Primary)  - Ambulatory referral to Cardiology   2. Cocaine abuse (HCC)  - AMB Referral VBCI Care Management   Follow up:  Follow up as scheduled

## 2023-02-09 ENCOUNTER — Telehealth: Payer: Self-pay | Admitting: *Deleted

## 2023-02-09 NOTE — Progress Notes (Signed)
Complex Care Management Note  Care Guide Note 02/09/2023 Name: [name] MRN: [id number] DOB: 03/03/1955  Greg Fry is a 68 y.o. year old male who sees [name], NP for primary care. I reached out to Greg Fry by phone today to offer complex care management services.  [name] was given information about Complex Care Management services today including:   The Complex Care Management services include support from the care team which includes your Nurse Coordinator, Clinical Social Worker, or Pharmacist.  The Complex Care Management team is here to help remove barriers to the health concerns and goals most important to you. Complex Care Management services are voluntary, and the patient may decline or stop services at any time by request to their care team member.   Complex Care Management Consent Status: Patient agreed to services and verbal consent obtained.   Follow up plan:  Telephone appointment with complex care management team member scheduled for:  02/14/2023  Encounter Outcome:  Patient Scheduled  [name], CMA, Care Guide [facility]  [facility], [facility] Guide Direct Dial: [phone]  Fax: [phone] Website: [email or web address]

## 2023-02-09 NOTE — Progress Notes (Signed)
Complex Care Management Note Care Guide Note  02/09/2023 Name: Greg Fry MRN: [id number] DOB: 02-Nov-1955   Complex Care Management Outreach Attempts: An unsuccessful telephone outreach was attempted today to offer the patient information about available complex care management services.  Follow Up Plan:  Additional outreach attempts will be made to offer the patient complex care management information and services.   Encounter Outcome:  No Answer  [name], CMA, Care Guide [facility]  [facility], [facility] Guide Direct Dial: [phone]  Fax: [phone] Website: [email or web address]

## 2023-02-10 DIAGNOSIS — I444 Left anterior fascicular block: Secondary | ICD-10-CM

## 2023-02-10 DIAGNOSIS — I493 Ventricular premature depolarization: Secondary | ICD-10-CM | POA: Diagnosis not present

## 2023-02-10 DIAGNOSIS — I639 Cerebral infarction, unspecified: Secondary | ICD-10-CM

## 2023-02-12 LAB — DRUG SCREEN 11 W/CONF, SE
Amphetamines, IA: NEGATIVE ng/mL
Barbiturates, IA: NEGATIVE ug/mL
Benzodiazepines, IA: NEGATIVE ng/mL
Cocaine & Metabolite, IA: NEGATIVE ng/mL
Ethyl Alcohol, Enz: NEGATIVE g/dL
Methadone, IA: NEGATIVE ng/mL
Opiates, IA: NEGATIVE ng/mL
Oxycodones, IA: NEGATIVE ng/mL
Phencyclidine, IA: NEGATIVE ng/mL
Propoxyphene, IA: NEGATIVE ng/mL
THC(Marijuana) Metabolite, IA: NEGATIVE ng/mL

## 2023-02-12 LAB — COMPREHENSIVE METABOLIC PANEL
ALT: 52 [IU]/L — ABNORMAL HIGH (ref 0–44)
AST: 21 [IU]/L (ref 0–40)
Albumin: 4.3 g/dL (ref 3.9–4.9)
Alkaline Phosphatase: 207 [IU]/L — ABNORMAL HIGH (ref 44–121)
BUN/Creatinine Ratio: 13 (ref 10–24)
BUN: 12 mg/dL (ref 8–27)
Bilirubin Total: 0.3 mg/dL (ref 0.0–1.2)
CO2: 22 mmol/L (ref 20–29)
Calcium: 10 mg/dL (ref 8.6–10.2)
Chloride: 104 mmol/L (ref 96–106)
Creatinine, Ser: 0.95 mg/dL (ref 0.76–1.27)
Globulin, Total: 3.3 g/dL (ref 1.5–4.5)
Glucose: 101 mg/dL — ABNORMAL HIGH (ref 70–99)
Potassium: 4.5 mmol/L (ref 3.5–5.2)
Sodium: 141 mmol/L (ref 134–144)
Total Protein: 7.6 g/dL (ref 6.0–8.5)
eGFR: 88 mL/min/{1.73_m2} (ref >59)

## 2023-02-12 LAB — CBC
Hematocrit: 48.1 % (ref 37.5–51.0)
Hemoglobin: 16.8 g/dL (ref 13.0–17.7)
MCH: 32 pg (ref 26.6–33.0)
MCHC: 34.9 g/dL (ref 31.5–35.7)
MCV: 92 fL (ref 79–97)
Platelets: 279 10*3/uL (ref 150–450)
RBC: 5.25 x10E6/uL (ref 4.14–5.80)
RDW: 12.1 % (ref 11.6–15.4)
WBC: 9.4 10*3/uL (ref 3.4–10.8)

## 2023-02-12 LAB — THYROID PANEL WITH TSH
Free Thyroxine Index: 3 (ref 1.2–4.9)
T3 Uptake Ratio: 31 % (ref 24–39)
T4, Total: 9.7 ug/dL (ref 4.5–12.0)
TSH: 0.268 u[IU]/mL — ABNORMAL LOW (ref 0.450–4.500)

## 2023-02-13 ENCOUNTER — Telehealth: Payer: Self-pay

## 2023-02-14 ENCOUNTER — Other Ambulatory Visit: Payer: Self-pay | Admitting: Nurse Practitioner

## 2023-02-14 ENCOUNTER — Encounter: Payer: Self-pay | Admitting: *Deleted

## 2023-02-14 DIAGNOSIS — E0590 Thyrotoxicosis, unspecified without thyrotoxic crisis or storm: Secondary | ICD-10-CM

## 2023-02-14 NOTE — Telephone Encounter (Signed)
Sent to [employer]

## 2023-02-16 ENCOUNTER — Ambulatory Visit: Payer: Self-pay | Admitting: Licensed Clinical Social Worker

## 2023-02-16 NOTE — Patient Instructions (Signed)
Visit Information  Thank you for taking time to visit with me today. Please don't hesitate to contact me if I can be of assistance to you.   Following are the goals we discussed today:   Goals Addressed             This Visit's Progress    Care Coordination             Please call the care guide team at [phone] if you need to cancel or reschedule your appointment.   If you are experiencing a Mental Health or Behavioral Health Crisis or need someone to talk to, please call 911   Patient verbalizes understanding of instructions and care plan provided today and agrees to view in MyChart. Active MyChart status and patient understanding of how to access instructions and care plan via MyChart confirmed with patient.     The patient has been provided with contact information for the care management team and has been advised to call with any health related questions or concerns.   Gwyndolyn Saxon MSW, LCSW Licensed Clinical Social Worker  [facility], Population Health Direct Dial: [phone]  Fax: [phone]

## 2023-02-16 NOTE — Patient Outreach (Signed)
  Care Coordination   Initial Visit Note   02/16/2023 Name: Greg Fry MRN: [id number] DOB: 09/14/1955  Greg Fry is a 68 y.o. year old male who sees [name], NP for primary care. I spoke with  Greg Fry and sister Greg Fry. by phone today.  What matters to the patients health and wellness today?  LCSW A. Felton Clinton and Pt D Riggi completed initial visit via phone. Greg Fry reports he does not need services offered through care coordination. Greg Fry sister contacted LCSW A. Felton Clinton via phone to discuss pt experience at hospital. Advised Ms. Hall to contact patient grievance [phone].    Goals Addressed             This Visit's Progress    Care Coordination          SDOH assessments and interventions completed:  Yes Per pt sister and  Greg Fry has a safe residence and no SDOH needs.  SDOH Interventions Today    Flowsheet Row Most Recent Value  SDOH Interventions   Food Insecurity Interventions Intervention Not Indicated  Housing Interventions Intervention Not Indicated  Transportation Interventions Intervention Not Indicated  Utilities Interventions Intervention Not Indicated        Care Coordination Interventions:  Yes, provided  Interventions Today    Flowsheet Row Most Recent Value  Chronic Disease   Chronic disease during today's visit Other  [Stroke/hx of substance use.]  General Interventions   General Interventions Discussed/Reviewed [employer] reports he has resources for substance use.]       Follow up plan: No further intervention required.   Encounter Outcome:  Patient Visit Completed   Gwyndolyn Saxon MSW, LCSW Licensed Clinical Social Worker  [facility], Population Health Direct Dial: [phone]  Fax: [phone]

## 2023-03-01 ENCOUNTER — Telehealth: Payer: Self-pay | Admitting: *Deleted

## 2023-03-01 ENCOUNTER — Telehealth: Payer: Self-pay | Admitting: Nurse Practitioner

## 2023-03-01 ENCOUNTER — Ambulatory Visit: Payer: Medicare Other | Admitting: Cardiology

## 2023-03-01 NOTE — Telephone Encounter (Unsigned)
Copied from CRM [id number]. Topic: Clinical - Medical Advice >> Mar 01, 2023  8:29 AM Gildardo Pounds wrote: Reason for CRM: Inocencio Homes with [facility] called regarding the patient. Attempted to reach CAL but no answer the two times I tried. Call dropped before getting a callback number to send message.

## 2023-03-01 NOTE — Telephone Encounter (Signed)
Per Dr. Wyline Mood - patient referred for calcification of bilateral carotid arteries, he needs to be referred to vascular as opposed to [place].    Appointment cancelled for today.  Patient & sister notified.    Will send notification to pcp.

## 2023-03-01 NOTE — Telephone Encounter (Signed)
Copied from CRM [id number]. Topic: Clinical - Medical Advice >> Mar 01, 2023  9:16 AM Gaetano Hawthorne wrote: Reason for CRM: Patient's sister is calling to report that the Cardiologist stated that he no longer needs to see Dr. Wyline Mood, however, he would like him to see the Vein specialists on [address] - Patient needs a referral and asked if Charice could give her a call when she had a moment.

## 2023-03-01 NOTE — Progress Notes (Deleted)
      Clinical Summary [name] is a 68 y.o.male  1.Carotid stenosis - Jan 2025 carotid US: difficult visualization, heavy plaque noted. Parvus tardua waveform left ICA, suggests proximal ICA stenosis  2. History of CVA Past Medical History:  Diagnosis Date   Dislocated shoulder    Stroke (HCC)      Allergies  Allergen Reactions   Penicillins Other (See Comments)    Child hood reaction     Current Outpatient Medications  Medication Sig Dispense Refill   amLODipine (NORVASC) 5 MG tablet Take 1 tablet (5 mg total) by mouth daily. 90 tablet 1   aspirin EC 81 MG tablet Take 1 tablet (81 mg total) by mouth every 6 (six) hours as needed (high blood pressure). Swallow whole. 90 tablet 2   No current facility-administered medications for this visit.     Past Surgical History:  Procedure Laterality Date   left shoulder dislocation Left    SHOULDER CLOSED REDUCTION Left 06/28/2013   Procedure: CLOSED REDUCTION LEFT SHOULDER UNDER ANESTHESIA;  Surgeon: Vickki Hearing, MD;  Location: [facility];  Service: Orthopedics;  Laterality: Left;     Allergies  Allergen Reactions   Penicillins Other (See Comments)    Child hood reaction      No family history on file.   Social History [name] reports that he has quit smoking. His smoking use included cigarettes. He does not have any smokeless tobacco history on file. [name] reports current alcohol use.   Review of Systems CONSTITUTIONAL: No weight loss, fever, chills, weakness or fatigue.  HEENT: Eyes: No visual loss, blurred vision, double vision or yellow sclerae.No hearing loss, sneezing, congestion, runny nose or sore throat.  SKIN: No rash or itching.  CARDIOVASCULAR:  RESPIRATORY: No shortness of breath, cough or sputum.  GASTROINTESTINAL: No anorexia, nausea, vomiting or diarrhea. No abdominal pain or blood.  GENITOURINARY: No burning on urination, no polyuria NEUROLOGICAL: No headache, dizziness, syncope,  paralysis, ataxia, numbness or tingling in the extremities. No change in bowel or bladder control.  MUSCULOSKELETAL: No muscle, back pain, joint pain or stiffness.  LYMPHATICS: No enlarged nodes. No history of splenectomy.  PSYCHIATRIC: No history of depression or anxiety.  ENDOCRINOLOGIC: No reports of sweating, cold or heat intolerance. No polyuria or polydipsia.  Marland Kitchen   Physical Examination There were no vitals filed for this visit. There were no vitals filed for this visit.  Gen: resting comfortably, no acute distress HEENT: no scleral icterus, pupils equal round and reactive, no palptable cervical adenopathy,  CV Resp: Clear to auscultation bilaterally GI: abdomen is soft, non-tender, non-distended, normal bowel sounds, no hepatosplenomegaly MSK: extremities are warm, no edema.  Skin: warm, no rash Neuro:  no focal deficits Psych: appropriate affect   Diagnostic Studies     Assessment and Plan        Greg Fry, M.D., F.A.C.C.

## 2023-03-03 ENCOUNTER — Telehealth: Payer: Self-pay

## 2023-03-03 NOTE — Telephone Encounter (Signed)
Attn: Charrice   Sister Darl Pikes returning call. Regarding Vein and Vascular study.   Please advise

## 2023-03-03 NOTE — Telephone Encounter (Signed)
Copied from CRM [id number]. Topic: General - Call Back - No Documentation >> Mar 03, 2023  1:47 PM Higinio Roger wrote: Reason for CRM: Patient's sister is calling to report that the Cardiologist stated that he no longer needs to see Dr. Wyline Mood, however, he would like him to see the Vein specialists on [address] - Patient needs a referral and asked if Charice could give her a call when she had a moment. Callback number: [phone]

## 2023-03-08 ENCOUNTER — Other Ambulatory Visit: Payer: Self-pay | Admitting: Nurse Practitioner

## 2023-03-08 DIAGNOSIS — I6523 Occlusion and stenosis of bilateral carotid arteries: Secondary | ICD-10-CM

## 2023-03-10 ENCOUNTER — Other Ambulatory Visit: Payer: Self-pay

## 2023-03-10 DIAGNOSIS — I6529 Occlusion and stenosis of unspecified carotid artery: Secondary | ICD-10-CM

## 2023-03-13 ENCOUNTER — Ambulatory Visit: Payer: Medicare Other | Attending: Cardiology

## 2023-03-13 DIAGNOSIS — I639 Cerebral infarction, unspecified: Secondary | ICD-10-CM

## 2023-03-13 DIAGNOSIS — I493 Ventricular premature depolarization: Secondary | ICD-10-CM

## 2023-03-13 DIAGNOSIS — I444 Left anterior fascicular block: Secondary | ICD-10-CM

## 2023-03-14 ENCOUNTER — Ambulatory Visit (INDEPENDENT_AMBULATORY_CARE_PROVIDER_SITE_OTHER): Payer: Medicare Other

## 2023-03-14 DIAGNOSIS — I6529 Occlusion and stenosis of unspecified carotid artery: Secondary | ICD-10-CM

## 2023-03-15 ENCOUNTER — Telehealth: Payer: Self-pay

## 2023-03-15 ENCOUNTER — Ambulatory Visit (INDEPENDENT_AMBULATORY_CARE_PROVIDER_SITE_OTHER): Payer: Medicare Other | Admitting: Vascular Surgery

## 2023-03-15 ENCOUNTER — Encounter: Payer: Self-pay | Admitting: Vascular Surgery

## 2023-03-15 VITALS — BP 143/96 | HR 103 | Temp 97.9°F | Resp 20 | Ht 70.0 in | Wt 237.0 lb

## 2023-03-15 DIAGNOSIS — I6529 Occlusion and stenosis of unspecified carotid artery: Secondary | ICD-10-CM | POA: Diagnosis not present

## 2023-03-15 MED ORDER — ROSUVASTATIN CALCIUM 10 MG PO TABS: 10.0000 mg | ORAL_TABLET | Freq: Every day | ORAL | 11 refills | Status: AC

## 2023-03-15 NOTE — Progress Notes (Signed)
 Patient ID: Greg Fry, male   DOB: 02-05-1955, 68 y.o.   MRN: [id number]  Reason for Consult: New Patient (Initial Visit)   Referred by [name], NP  Subjective:     HPI:  Greg Fry is a 68 y.o. male recently admitted with pontine stroke thought secondary to small vessel disease versus vasospasm found to have bilateral carotid stenosis on CTA now following up with duplex.  He is now on aspirin and Plavix was taken for 3 weeks was not started due to LDL being 51.  He was a followed by neurology in the hospital now with plan for outpatient evaluation by cardiology.  No further symptoms since recent admission.  Past Medical History:  Diagnosis Date   Dislocated shoulder    Hypertension    Stroke [facility])    History reviewed. No pertinent family history. Past Surgical History:  Procedure Laterality Date   left shoulder dislocation Left    SHOULDER CLOSED REDUCTION Left 06/28/2013   Procedure: CLOSED REDUCTION LEFT SHOULDER UNDER ANESTHESIA;  Surgeon: Vickki Hearing, MD;  Location: [facility];  Service: Orthopedics;  Laterality: Left;    Short Social History:  Social History   Tobacco Use   Smoking status: Former    Types: Cigarettes   Smokeless tobacco: Not on file   Tobacco comments:    Stopped smoking 5 years ago.  Substance Use Topics   Alcohol use: Yes    Comment: occasion    Allergies  Allergen Reactions   Penicillins Other (See Comments)    Child hood reaction    Current Outpatient Medications  Medication Sig Dispense Refill   amLODipine (NORVASC) 5 MG tablet Take 1 tablet (5 mg total) by mouth daily. 90 tablet 1   aspirin EC 81 MG tablet Take 1 tablet (81 mg total) by mouth every 6 (six) hours as needed (high blood pressure). Swallow whole. 90 tablet 2   No current facility-administered medications for this visit.    Review of Systems  Constitutional:  Constitutional negative. HENT: HENT negative.  Eyes: Positive for visual disturbance.    Respiratory: Respiratory negative.  Cardiovascular: Cardiovascular negative.  GI: Gastrointestinal negative.  Musculoskeletal: Musculoskeletal negative.  Skin: Skin negative.  Neurological: Neurological negative. Hematologic: Hematologic/lymphatic negative.  Psychiatric: Psychiatric negative.        Objective:  Objective   Vitals:   03/15/23 1526 03/15/23 1529  BP: (!) 150/91 (!) 143/96  Pulse: (!) 103   Resp: 20   Temp: 97.9 F (36.6 C)   SpO2: 95%   Weight: 237 lb (107.5 kg)   Height: 5\' 10"  (1.778 m)    Body mass index is 34.01 kg/m.  Physical Exam HENT:     Mouth/Throat:     Mouth: Mucous membranes are moist.  Eyes:     Pupils: Pupils are equal, round, and reactive to light.  Neck:     Vascular: No carotid bruit.  Cardiovascular:     Rate and Rhythm: Normal rate.     Pulses:          Posterior tibial pulses are 1+ on the right side and 2+ on the left side.  Pulmonary:     Effort: Pulmonary effort is normal.  Musculoskeletal:     Right lower leg: No edema.     Left lower leg: No edema.  Skin:    General: Skin is warm.     Capillary Refill: Capillary refill takes less than 2 seconds.  Neurological:  General: No focal deficit present.     Mental Status: He is alert.  Psychiatric:        Mood and Affect: Mood normal.        Thought Content: Thought content normal.        Judgment: Judgment normal.     Data: Right Carotid Findings:  +----------+--------+--------+--------+------------------+--------+           PSV cm/sEDV cm/sStenosisPlaque DescriptionComments  +----------+--------+--------+--------+------------------+--------+  CCA Prox  101     16                                          +----------+--------+--------+--------+------------------+--------+  CCA Mid   147     23              heterogenous                +----------+--------+--------+--------+------------------+--------+  CCA Distal131     25               calcific                    +----------+--------+--------+--------+------------------+--------+  ICA Prox  125     24      1-39%   calcific                    +----------+--------+--------+--------+------------------+--------+  ICA Mid   85      18                                          +----------+--------+--------+--------+------------------+--------+  ICA Distal47      14                                          +----------+--------+--------+--------+------------------+--------+  ECA      170     22                                          +----------+--------+--------+--------+------------------+--------+   +----------+--------+-------+----------------+-------------------+           PSV cm/sEDV cmsDescribe        Arm Pressure (mmHG)  +----------+--------+-------+----------------+-------------------+  [id number]           Multiphasic, WNL                     +----------+--------+-------+----------------+-------------------+   +---------+--------+--+--------+-+---------+  VertebralPSV cm/s59EDV cm/s6Antegrade  +---------+--------+--+--------+-+---------+      Left Carotid Findings:  +----------+--------+--------+--------+-------------------------+--------+           PSV cm/sEDV cm/sStenosisPlaque Description       Comments  +----------+--------+--------+--------+-------------------------+--------+  CCA Prox  113     20                                                 +----------+--------+--------+--------+-------------------------+--------+  CCA Mid   88      14                                                 +----------+--------+--------+--------+-------------------------+--------+  CCA Distal72      19                                                 +----------+--------+--------+--------+-------------------------+--------+  ICA Prox  57      17      1-39%   heterogenous and calcific           +----------+--------+--------+--------+-------------------------+--------+  ICA Distal48      14                                       ?         +----------+--------+--------+--------+-------------------------+--------+  ECA      364     46                                       ?         +----------+--------+--------+--------+-------------------------+--------+   +----------+--------+--------+----------------+-------------------+           PSV cm/sEDV cm/sDescribe        Arm Pressure (mmHG)  +----------+--------+--------+----------------+-------------------+  [id number]            Multiphasic, WNL                     +----------+--------+--------+----------------+-------------------+   +---------+--------+--+--------+-+---------+  VertebralPSV cm/s38EDV cm/s9Antegrade  +---------+--------+--+--------+-+---------+         Summary:  Right Carotid: Velocities in the right ICA are consistent with a 1-39%  stenosis.   Left Carotid: Velocities in the left ICA appear consistent with a 1-39%                stenosis. Technically limited by tortuosity of the  bifurcation.               Unable to adequately differentiate internal and external  carotid               arteries.   Vertebrals:  Bilateral vertebral arteries demonstrate antegrade flow.  Subclavians: Normal flow hemodynamics were seen in bilateral subclavian               arteries.    CTA IMPRESSION: 1. No acute head CT finding. Old infarctions in both thalami. Chronic small-vessel ischemic changes of the cerebral hemispheric white matter. 2. Calcified plaque at both carotid bifurcations and ICA bulbs. 25% stenosis on each side. 3. No intracranial large vessel occlusion or proximal stenosis. 4. Thyroid goiter with intrathoracic extension. 5. 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 See previous recommendations for ENT  referral. 6. Benign appearing lipoma of the posterior neck at the craniocervical junction.        Assessment/Plan:    68 year old male recent admission pontine stroke secondary to small vessel disease versus vasospasm.  CTA demonstrated calcified carotid bifurcations which were both tortuous and quite medial and were difficult to identify by recent duplex but again confirmed low levels of stenosis.  He remains on aspirin with plans for cardiology evaluation.  I have recommended low-dose statin given his LDL of 51 but due to the anti-inflammatory effects of statins he should likely continue although I have cautioned against the side effects.  Crestor 10 mg sent to his pharmacy and will follow-up in 1 year with repeat carotid duplex unless there are issues that require attention prior.    Maeola Harman MD [facility]

## 2023-03-15 NOTE — Telephone Encounter (Signed)
 Copied from CRM [id number]. Topic: General - Other >> Mar 15, 2023  9:44 AM Kristie Cowman wrote: Reason for CRM: Patient's sister is calling regarding all the metabolic panel labs that have been drawn on her brother as Medicare is refusing to pay for any of them and they are having to pay out of pocket every time.  She would like someone from the office to call her back regarding this situation.   Patient's sister is extremely frustrated with this as she has called numerous times regarding this.

## 2023-03-23 ENCOUNTER — Encounter: Payer: Self-pay | Admitting: Endocrinology

## 2023-03-23 ENCOUNTER — Other Ambulatory Visit: Payer: Self-pay

## 2023-03-23 ENCOUNTER — Ambulatory Visit: Admitting: Endocrinology

## 2023-03-23 VITALS — BP 128/82 | HR 102 | Ht 70.0 in | Wt 239.0 lb

## 2023-03-23 DIAGNOSIS — E042 Nontoxic multinodular goiter: Secondary | ICD-10-CM | POA: Diagnosis not present

## 2023-03-23 DIAGNOSIS — E0590 Thyrotoxicosis, unspecified without thyrotoxic crisis or storm: Secondary | ICD-10-CM | POA: Diagnosis not present

## 2023-03-23 NOTE — Progress Notes (Signed)
 Outpatient Endocrinology Note [place] Anari Evitt, MD   Patient's Name: Greg Fry    DOB: [birth year]    MRN: [id number]  REASON OF VISIT: New consult for clinical hyperthyroidism / thyroid goiter  REFERRING PROVIDER: [name], NP   PCP: [name], NP  HISTORY OF PRESENT ILLNESS:   Greg Fry is a 68 y.o. old male with past medical history as listed below is presented for new consult for subclinical hyperthyroidism and thyroid goiter.   Pertinent Thyroid History: Patient was hospitalized in January 2025 due to stroke, CT angiogram head and neck with IV iodine contrast on January 31, 2023 showed thyroid goiter with intrathoracic extension, mainly left substernal goiter, CT images reviewed.  He has not had ultrasound thyroid yet.  Patient has mildly low TSH, with normal free T4 and T3 consistent with subclinical hyperthyroidism, noted at least from June 2024, and most recent in January 2025.  In January when he had subclinical hyperthyroidism, he had CT scan with IV iodine contrast prior to the lab test.  Patient denies palpitation or heat intolerance.  No increased sweating.  Body weight is relatively stable.  He denies neck discomfort, dysphagia, change in voice or difficulty breathing.  He has never been on thyroid medication.  Family history of thyroid disorder in sister requiring thyroid surgery for goiter, no thyroid cancer.  Mother has goiter and also taking levothyroxine for hypothyroidism.  Noticed on exposure to head and neck.  Labs reviewed as follows:   Latest Reference Range & Units 07/07/22 15:43 09/29/22 14:19 12/22/22 14:03 01/30/23 04:27 02/08/23 14:23  TSH 0.450 - 4.500 uIU/mL 0.375 (L) 0.301 (L) 0.339 (L) 0.475 0.268 (L)  T4,Free(Direct) 0.61 - 1.12 ng/dL    1.47 (H)   Thyroxine (T4) 4.5 - 12.0 ug/dL  8.9 9.4  9.7  Free Thyroxine Index 1.2 - 4.9   2.6 2.8  3.0  T3 Uptake Ratio 24 - 39 %  29 30  31   (L): Data is abnormally low (H): Data is abnormally  high  Patient is accompanied by sister in the clinic today.   REVIEW OF SYSTEMS:  As per history of present illness.   PAST MEDICAL HISTORY: Past Medical History:  Diagnosis Date   Dislocated shoulder    Hypertension    Stroke [facility])     PAST SURGICAL HISTORY: Past Surgical History:  Procedure Laterality Date   left shoulder dislocation Left    SHOULDER CLOSED REDUCTION Left 06/28/2013   Procedure: CLOSED REDUCTION LEFT SHOULDER UNDER ANESTHESIA;  Surgeon: Vickki Hearing, MD;  Location: [facility];  Service: Orthopedics;  Laterality: Left;    ALLERGIES: Allergies  Allergen Reactions   Penicillins Other (See Comments)    Child hood reaction    FAMILY HISTORY:  History reviewed. No pertinent family history.  SOCIAL HISTORY: Social History   Socioeconomic History   Marital status: Divorced    Spouse name: Not on file   Number of children: Not on file   Years of education: Not on file   Highest education level: Not on file  Occupational History   Not on file  Tobacco Use   Smoking status: Former    Types: Cigarettes   Smokeless tobacco: Not on file   Tobacco comments:    Stopped smoking 5 years ago.  Substance and Sexual Activity   Alcohol use: Yes    Comment: occasion   Drug use: Not Currently    Types: Cocaine, Marijuana    Comment: pt  denies any use recently,    Sexual activity: Never  Other Topics Concern   Not on file  Social History Narrative   Not on file   Social Drivers of Health   Financial Resource Strain: Not on file  Food Insecurity: No Food Insecurity (02/16/2023)   Hunger Vital Sign    Worried About Running Out of Food in the Last Year: Never true    Ran Out of Food in the Last Year: Never true  Transportation Needs: No Transportation Needs (02/16/2023)   PRAPARE - Administrator, Civil Service (Medical): No    Lack of Transportation (Non-Medical): No  Physical Activity: Not on file  Stress: Not on file  Social Connections:  Moderately Integrated (01/29/2023)   Social Connection and Isolation Panel [NHANES]    Frequency of Communication with Friends and Family: More than three times a week    Frequency of Social Gatherings with Friends and Family: Twice a week    Attends Religious Services: More than 4 times per year    Active Member of [employer] or Organizations: No    Attends Banker Meetings: Never    Marital Status: Living with partner    MEDICATIONS:  Current Outpatient Medications  Medication Sig Dispense Refill   amLODipine (NORVASC) 5 MG tablet Take 1 tablet (5 mg total) by mouth daily. 90 tablet 1   aspirin EC 81 MG tablet Take 1 tablet (81 mg total) by mouth every 6 (six) hours as needed (high blood pressure). Swallow whole. 90 tablet 2   rosuvastatin (CRESTOR) 10 MG tablet Take 1 tablet (10 mg total) by mouth daily. 30 tablet 11   No current facility-administered medications for this visit.    PHYSICAL EXAM: Vitals:   03/23/23 1349  BP: 128/82  Pulse: (!) 102  SpO2: 98%  Weight: 239 lb (108.4 kg)  Height: 5\' 10"  (1.778 m)   Body mass index is 34.29 kg/m.  Wt Readings from Last 3 Encounters:  03/23/23 239 lb (108.4 kg)  03/15/23 237 lb (107.5 kg)  02/08/23 235 lb (106.6 kg)    General: Well developed, well nourished male in no apparent distress.  HEENT: AT/[place], no external lesions. Hearing intact to the spoken word Eyes: EOMI. No stare, proptosis or lid lag. Conjunctiva clear and no icterus. No erythema or watering Neck: Trachea midline, neck supple without appreciable thyromegaly or lymphadenopathy and left palpable thyroid nodule + Lungs: Clear to auscultation, no wheeze. Respirations not labored Heart: S1S2, Regular in rate and rhythm. No loud murmurs Abdomen: Soft, non tender, non distended Neurologic: Alert, oriented, normal speech, deep tendon biceps reflexes normal,  no gross focal neurological deficit Extremities: No pedal pitting edema, no tremors of outstretched  hands Skin: Warm, color good.  Psychiatric: Does not appear depressed or anxious  PERTINENT HISTORIC LABORATORY AND IMAGING STUDIES:  All pertinent laboratory results were reviewed. Please see HPI also for further details.   TSH  Date Value Ref Range Status  02/08/2023 0.268 (L) 0.450 - 4.500 uIU/mL Final  01/30/2023 0.475 0.350 - 4.500 uIU/mL Final    Comment:    Performed by a 3rd Generation assay with a functional sensitivity of <=0.01 uIU/mL. Performed at [facility], [address], [place], [place] [address]   12/22/2022 0.339 (L) 0.450 - 4.500 uIU/mL Final    Lab Results  Component Value Date   FREET4 1.16 (H) 01/30/2023   TSH 0.268 (L) 02/08/2023   TSH 0.475 01/30/2023   TSH 0.339 (L)  12/22/2022    No results found for: "THYROTRECAB"  Lab Results  Component Value Date   TSH 0.268 (L) 02/08/2023   TSH 0.475 01/30/2023   TSH 0.339 (L) 12/22/2022   FREET4 1.16 (H) 01/30/2023     No results found for: "TSI"   No components found for: "TRAB"    ASSESSMENT / PLAN  1. Subclinical hyperthyroidism   2. Multinodular goiter    -Patient has incidental finding of thyroid goiter with left substernal goiter/intrathoracic extension on CTA neck in January 2025.  Patient denies neck compressive symptoms.  Patient has lab results consistent with subclinical hyperthyroidism with low TSH at least from June 2024 and most recently in January 2025 with normal free T4 and T3.  Patient had lab results consistent with subclinical hyperthyroidism in January and had CT scan with IV iodine contrast prior to that.  However patient was having low TSH consistent with subclinical hyperthyroidism prior to January starting from June 2024. -Etiology of subclinical hyperthyroidism is unclear at this time.  At least in January 2025 it can be related to IV iodine contrast induced.  It could be hyperfunctioning thyroid nodule or autoimmune hyperthyroidism/Graves' disease.  Plan: -Check thyroid function  test TSH, free T4, free T3 today. -Check thyroid autoantibodies including TRAb, TSI for Graves' disease and thyroglobulin and thyroid peroxidase antibody for Hashimoto's thyroiditis. -Check ultrasound thyroid for thyroid goiter. -Discussed that based on ultrasound finding a probably need needle biopsy. -Patient in the clinic today is not sure about further testing about thyroid goiter as it has not been bothering him.  He agreed for laboratory test and ultrasound thyroid today.  Will continue to discuss as medically necessary based on the test results from today.  Greg Fry was seen today for establish care.  Diagnoses and all orders for this visit:  Subclinical hyperthyroidism -     T4, free -     T3, free -     TSH -     TRAb (TSH Receptor Binding Antibody) -     Thyroglobulin antibody -     Thyroid peroxidase antibody -     Thyroid stimulating immunoglobulin  Multinodular goiter -     US THYROID; Future    DISPOSITION Follow up in clinic in 2 months suggested.  All questions answered and patient verbalized understanding of the plan.  [place] Rolf Fells, MD [facility] [address] [place], [address] [place], [place] [address] Phone # [phone]   At least part of this note was generated using voice recognition software. Inadvertent word errors may have occurred, which were not recognized during the proofreading process.

## 2023-03-23 NOTE — Progress Notes (Signed)
 Cardiology Office Note:   Date:  03/24/2023  ID:  Greg Fry, DOB 1955-10-23, MRN [id number] PCP:  Greg Andrew, NP  [facility] Providers Cardiologist:  [name], MD Referring MD: Greg Andrew, NP  Chief Complaint/Reason for Referral: Hospital follow-up stroke ASSESSMENT:    1. Cerebrovascular accident (CVA), unspecified mechanism (HCC)   2. Hyperlipidemia LDL goal <55   3. Primary hypertension   4. CKD (chronic kidney disease) stage 2, GFR 60-89 ml/min   5. Bilateral carotid artery stenosis   6. Diastolic dysfunction     PLAN:   In order of problems listed above: Stroke: Likely due to small vessel disease in the context of substance abuse.  No intracardiac shunt by bubble study echocardiogram and 30-day monitor without atrial fibrillation.  Continue aspirin 81 mg and Crestor 10 mg. Hyperlipidemia: LDL in January was at goal of 51.  Plan on checking LP(a) in the future. Hypertension: Blood pressure is well-controlled today.  However given evidence of diastolic dysfunction with LVH and CKD we will stop amlodipine and start Micardis 40 mg daily. CKD stage II: Given CKD and LVH will discontinue amlodipine and start Micardis 40 mg daily and check B MP in 1 week.   Carotid disease: Mild bilaterally.  Continue aggressive treatment of cardiovascular risk factors.  Continue aspirin 81 mg, Crestor 10 mg, and strict blood pressure control. Diastolic dysfunction: Given moderate LVH we will start Micardis 40 mg daily as above.            Dispo:  Return in about 6 months (around 09/24/2023) for Routine follow up in 6 months with one of our NP or PA.      Medication Adjustments/Labs and Tests Ordered: Current medicines are reviewed at length with the patient today.  Concerns regarding medicines are outlined above.  The following changes have been made:     Labs/tests ordered: Orders Placed This Encounter  Procedures   Basic Metabolic Panel (BMET)    Medication  Changes: Meds ordered this encounter  Medications   telmisartan (MICARDIS) 40 MG tablet    Sig: Take 1 tablet (40 mg total) by mouth daily.    Dispense:  30 tablet    Refill:  6    Current medicines are reviewed at length with the patient today.  The patient does not have concerns regarding medicines.     History of Present Illness:      FOCUSED PROBLEM LIST:   CVA January 2025 MR posterior pons acute infarction CTA chronic infarctions bilateral thalami, small vessel microvascular disease, 25% disease of bilateral ICAs without LVO No intracardiac shunt by bubble study TTE 30-day monitor without atrial fibrillation Hyperlipidemia Hypertension Diastolic dysfunction G1 DD, moderate LVH, no significant valve problems, EF 70 to 75% LAFB CKD stage II BMI 34 Cocaine abuse UDS + January 2025  March 2025:  Patient consents to use of AI scribe. The patient is a 68 year old male with above listed medical problems for hospital follow-up after stroke.  The patient was in his normal state of health up until January when he presented with acute diplopia.  He was diagnosed with an infarct in his posterior pons.  Bubble study echocardiogram demonstrated no intracardiac shunts.  He had a 30-day monitor placed as an outpatient which showed atrial fibrillation.  He did see vascular surgery who recommended no interventions as his CTA in the hospital demonstrated no high-grade carotid stenoses.  He was discharged with medications and has been doing well since leaving the  hospital. No recurrent signs or symptoms of stroke, such as chest pain, palpitations, or leg swelling.  His blood pressure has been well-controlled, although it was elevated last week at 140-150 mmHg. He is currently taking amlodipine, which was started upon discharge. He is on a medication regimen that includes amlodipine, Crestor, and aspirin. Crestor was added a week ago, and he takes aspirin every morning since his hospital stay.   An ultrasound revealed an enlarged thyroid, and he is scheduled for another ultrasound on Monday afternoon. He is currently taking medication for this condition.  He is retired and previously worked in Engineer, agricultural. He denies smoking          Current Medications: Current Meds  Medication Sig   aspirin EC 81 MG tablet Take 1 tablet (81 mg total) by mouth every 6 (six) hours as needed (high blood pressure). Swallow whole.   rosuvastatin (CRESTOR) 10 MG tablet Take 1 tablet (10 mg total) by mouth daily.   telmisartan (MICARDIS) 40 MG tablet Take 1 tablet (40 mg total) by mouth daily.   [DISCONTINUED] amLODipine (NORVASC) 5 MG tablet Take 1 tablet (5 mg total) by mouth daily.     Review of Systems:   Please see the history of present illness.    All other systems reviewed and are negative.     EKGs/Labs/Other Test Reviewed:   EKG: EKG from January 2025 demonstrates sinus rhythm with left anterior fascicular block  EKG Interpretation Date/Time:    Ventricular Rate:    PR Interval:    QRS Duration:    QT Interval:    QTC Calculation:   R Axis:      Text Interpretation:           Risk Assessment/Calculations:          Physical Exam:   VS:  BP 118/82   Pulse 90   Ht 5' 10.5" (1.791 m)   Wt 235 lb 12.8 oz (107 kg)   SpO2 96%   BMI 33.36 kg/m        Wt Readings from Last 3 Encounters:  03/24/23 235 lb 12.8 oz (107 kg)  03/23/23 239 lb (108.4 kg)  03/15/23 237 lb (107.5 kg)      GENERAL:  No apparent distress, AOx3 HEENT:  No carotid bruits, +2 carotid impulses, no scleral icterus CAR: RRR no murmurs, gallops, rubs, or thrills RES:  Clear to auscultation bilaterally ABD:  Soft, nontender, nondistended, positive bowel sounds x 4 VASC:  +2 radial pulses, +2 carotid pulses NEURO:  CN 2-12 grossly intact; motor and sensory grossly intact PSYCH:  No active depression or anxiety EXT:  No edema, ecchymosis, or cyanosis  Signed, [name], MD  03/24/2023 9:46 AM    [facility] [address] [place], [place], [place]  [address] Phone: [phone]; Fax: [phone]   Note:  This document was prepared using Dragon voice recognition software and may include unintentional dictation errors.

## 2023-03-24 ENCOUNTER — Ambulatory Visit: Attending: Internal Medicine | Admitting: Internal Medicine

## 2023-03-24 ENCOUNTER — Encounter: Payer: Self-pay | Admitting: Internal Medicine

## 2023-03-24 ENCOUNTER — Other Ambulatory Visit: Payer: Self-pay | Admitting: *Deleted

## 2023-03-24 VITALS — BP 118/82 | HR 90 | Ht 70.5 in | Wt 235.8 lb

## 2023-03-24 DIAGNOSIS — I639 Cerebral infarction, unspecified: Secondary | ICD-10-CM | POA: Diagnosis present

## 2023-03-24 DIAGNOSIS — E785 Hyperlipidemia, unspecified: Secondary | ICD-10-CM

## 2023-03-24 DIAGNOSIS — I5189 Other ill-defined heart diseases: Secondary | ICD-10-CM

## 2023-03-24 DIAGNOSIS — N182 Chronic kidney disease, stage 2 (mild): Secondary | ICD-10-CM

## 2023-03-24 DIAGNOSIS — I6523 Occlusion and stenosis of bilateral carotid arteries: Secondary | ICD-10-CM | POA: Insufficient documentation

## 2023-03-24 DIAGNOSIS — I10 Essential (primary) hypertension: Secondary | ICD-10-CM | POA: Insufficient documentation

## 2023-03-24 MED ORDER — TELMISARTAN 40 MG PO TABS: 40.0000 mg | ORAL_TABLET | Freq: Every day | ORAL | 6 refills | Status: DC

## 2023-03-24 NOTE — Patient Instructions (Signed)
 Medication Instructions:   DISCONTINUE Amlodipine.  START Micardis one (1) tablet by mouth ( 40 mg ) daily.   *If you need a refill on your cardiac medications before your next appointment, please call your pharmacy*   Lab Work:  Your physician recommends that you return for lab work in: one week.  No fasting at any [facility] listed below. Patient given paperwork today.         If you have labs (blood work) drawn today and your tests are completely normal, you will receive your results only by: MyChart Message (if you have MyChart) OR A paper copy in the mail If you have any lab test that is abnormal or we need to change your treatment, we will call you to review the results.   Testing/Procedures:  None ordered.   Follow-Up: At [facility], you and your health needs are our priority.  As part of our continuing mission to provide you with exceptional heart care, we have created designated Provider Care Teams.  These Care Teams include your primary Cardiologist (physician) and Advanced Practice Providers (APPs -  Physician Assistants and Nurse Practitioners) who all work together to provide you with the care you need, when you need it.  We recommend signing up for the patient portal called "MyChart".  Sign up information is provided on this After Visit Summary.  MyChart is used to connect with patients for Virtual Visits (Telemedicine).  Patients are able to view lab/test results, encounter notes, upcoming appointments, etc.  Non-urgent messages can be sent to your provider as well.   To learn more about what you can do with MyChart, go to [email or web address].    Your next appointment:   6 month(s)  Provider:   Jari Favre, PA-C, Robin Searing, NP, or Tereso Newcomer, PA-C         Other Instructions  Your physician wants you to follow-up in: 6 months.  You will receive a reminder letter in the mail two months in advance. If you don't receive a letter, please call our  office to schedule the follow-up appointment.    1st Floor: - Lobby - Registration  - Pharmacy  - Lab - Cafe  2nd Floor: - PV Lab - Diagnostic Testing (echo, CT, nuclear med)  3rd Floor: - Vacant  4th Floor: - TCTS (cardiothoracic surgery) - AFib Clinic - Structural Heart Clinic - Vascular Surgery  - Vascular Ultrasound  5th Floor: - HeartCare Cardiology (general and EP) - Clinical Pharmacy for coumadin, hypertension, lipid, weight-loss medications, and med management appointments    Valet parking services will be available as well.

## 2023-03-27 ENCOUNTER — Ambulatory Visit
Admission: RE | Admit: 2023-03-27 | Discharge: 2023-03-27 | Disposition: A | Discharge status: Home / Self Care | Source: Ambulatory Visit | Attending: Endocrinology

## 2023-03-27 DIAGNOSIS — E042 Nontoxic multinodular goiter: Secondary | ICD-10-CM

## 2023-03-28 LAB — TSH: TSH: 0.06 m[IU]/L — ABNORMAL LOW (ref 0.40–4.50)

## 2023-03-28 LAB — THYROID STIMULATING IMMUNOGLOBULIN: TSI: <89 %{baseline} (ref <140)

## 2023-03-28 LAB — THYROGLOBULIN ANTIBODY: Thyroglobulin Ab: <1 [IU]/mL (ref <=1)

## 2023-03-28 LAB — T3, FREE: T3, Free: 4.3 pg/mL — ABNORMAL HIGH (ref 2.3–4.2)

## 2023-03-28 LAB — T4, FREE: Free T4: 1.5 ng/dL (ref 0.8–1.8)

## 2023-03-28 LAB — THYROID PEROXIDASE ANTIBODY: Thyroperoxidase Ab SerPl-aCnc: <1 [IU]/mL (ref <9)

## 2023-03-28 LAB — TRAB (TSH RECEPTOR BINDING ANTIBODY): TRAB: <1 IU/L (ref <=2.00)

## 2023-03-29 ENCOUNTER — Other Ambulatory Visit: Payer: Self-pay | Admitting: Nurse Practitioner

## 2023-03-29 DIAGNOSIS — I6523 Occlusion and stenosis of bilateral carotid arteries: Secondary | ICD-10-CM

## 2023-03-31 ENCOUNTER — Telehealth: Payer: Self-pay

## 2023-03-31 NOTE — Telephone Encounter (Signed)
-----   Message from [place] Thapa sent at 03/31/2023  8:18 AM EDT ----- Please notify patient of ultrasound thyroid reviewed showed multiple thyroid nodules/multinodular goiter, with no concerning features.  Will continue to monitor at this time.  He has overactive thyroid, need to monitor thyroid function test as well.  Will recheck lab at the time of follow-up visit in May.

## 2023-03-31 NOTE — Telephone Encounter (Signed)
 Patient's sister requesting copy of labs be sent in mail, to keep for patient records. Labs sent

## 2023-03-31 NOTE — Telephone Encounter (Signed)
 Patient attempted to give results, no VM left , will call back.

## 2023-03-31 NOTE — Telephone Encounter (Signed)
 Patient's sister given results and medication changes as directed by MD. No further questions at this time. Sister transferred to front desk to place lab appointment.

## 2023-04-01 LAB — BASIC METABOLIC PANEL
BUN/Creatinine Ratio: 15 (ref 10–24)
BUN: 16 mg/dL (ref 8–27)
CO2: 18 mmol/L — ABNORMAL LOW (ref 20–29)
Calcium: 9.4 mg/dL (ref 8.6–10.2)
Chloride: 106 mmol/L (ref 96–106)
Creatinine, Ser: 1.09 mg/dL (ref 0.76–1.27)
Glucose: 126 mg/dL — ABNORMAL HIGH (ref 70–99)
Potassium: 4.5 mmol/L (ref 3.5–5.2)
Sodium: 141 mmol/L (ref 134–144)
eGFR: 75 mL/min/{1.73_m2} (ref >59)

## 2023-04-04 ENCOUNTER — Ambulatory Visit (INDEPENDENT_AMBULATORY_CARE_PROVIDER_SITE_OTHER): Admitting: Neurology

## 2023-04-04 ENCOUNTER — Encounter: Payer: Self-pay | Admitting: Neurology

## 2023-04-04 VITALS — BP 131/86 | Ht 70.0 in | Wt 240.0 lb

## 2023-04-04 DIAGNOSIS — I6350 Cerebral infarction due to unspecified occlusion or stenosis of unspecified cerebral artery: Secondary | ICD-10-CM

## 2023-04-04 DIAGNOSIS — Z9189 Other specified personal risk factors, not elsewhere classified: Secondary | ICD-10-CM

## 2023-04-04 DIAGNOSIS — I6381 Other cerebral infarction due to occlusion or stenosis of small artery: Secondary | ICD-10-CM | POA: Diagnosis not present

## 2023-04-04 DIAGNOSIS — Z8669 Personal history of other diseases of the nervous system and sense organs: Secondary | ICD-10-CM

## 2023-04-04 NOTE — Progress Notes (Signed)
 [facility] [address] [employer]. [place] [address] [phone]       OFFICE CONSULT NOTE  [name] Date of Birth:  27-Oct-1955 Medical Record Number:  [id number]   Referring MD: Lindie Spruce  Reason for Referral: Stroke  HPI: Greg Fry is a 68 year old Caucasian male seen today for initial office consultation visit for stroke.  He is accompanied by his sister.  History is obtained from them and review of electronic medical records and I personally reviewed pertinent available imaging films in PACS.  He has past medical history of hypertension, cocaine abuse, nicotine use disorder, obesity and stroke.  He presented on 01/31/2023 with sudden onset of double vision and unsteadiness and dizziness.  He describes horizontal binocular diplopia which improved when he closed either eye.  He felt off balance when walking because of this.  His symptoms did not improve finally he called his sister and came to the hospital for evaluation. CT head showed no acute abnormality.  MRI scan showed tiny diffusion positive lesion in the dorsal pons on the right compatible with acute lacunar infarct.  CT angiogram showed calcific plaques in both carotid bifurcations but only 25% stenosis.  Echocardiogram showed ejection fraction of 70-75%.  Left atrial size was normal.  Hemoglobin A1c was 5.7.  LDL cholesterol 51 mg percent.  Urine drug screen was positive for cocaine.  Patient was started on dual antiplatelet therapy and counseled to quit cocaine.  Patient states his double vision and gait imbalance improved in 2 to 3 weeks and is back to his baseline.  He is currently on aspirin 81 mg starting well with minor bruising and no bleeding.  He is also taking Crestor 10 mg daily which is tolerating well without side effects.  States his blood pressure is well-controlled on Micardis which is tolerating well 2.  He did wear external cardiac monitor which was negative for A-fib.  Patient appears to  be at risk for sleep apnea but is reluctant to undergo any further testing at this time.  He denies any prior history of strokes TIA seizures or significant neurological problems. ROS:   14 system review of systems is positive for diplopia, blurred vision, ataxia, imbalance all other systems negative  PMH:  Past Medical History:  Diagnosis Date   Dislocated shoulder    Hypertension    Stroke [facility])     Social History:  Social History   Socioeconomic History   Marital status: Divorced    Spouse name: Not on file   Number of children: Not on file   Years of education: Not on file   Highest education level: Not on file  Occupational History   Not on file  Tobacco Use   Smoking status: Former    Types: Cigarettes   Smokeless tobacco: Not on file   Tobacco comments:    Stopped smoking 5 years ago.  Substance and Sexual Activity   Alcohol use: Yes    Comment: occasion   Drug use: Not Currently    Types: Cocaine, Marijuana    Comment: pt denies any use recently,    Sexual activity: Never  Other Topics Concern   Not on file  Social History Narrative   Not on file   Social Drivers of Health   Financial Resource Strain: Not on file  Food Insecurity: No Food Insecurity (02/16/2023)   Hunger Vital Sign    Worried About Running Out of Food in the Last Year: Never true  Ran Out of Food in the Last Year: Never true  Transportation Needs: No Transportation Needs (02/16/2023)   PRAPARE - Administrator, Civil Service (Medical): No    Lack of Transportation (Non-Medical): No  Physical Activity: Not on file  Stress: Not on file  Social Connections: Moderately Integrated (01/29/2023)   Social Connection and Isolation Panel [NHANES]    Frequency of Communication with Friends and Family: More than three times a week    Frequency of Social Gatherings with Friends and Family: Twice a week    Attends Religious Services: More than 4 times per year    Active Member of [employer]  or Organizations: No    Attends Banker Meetings: Never    Marital Status: Living with partner  Intimate Partner Violence: Not At Risk (02/16/2023)   Humiliation, Afraid, Rape, and Kick questionnaire    Fear of Current or Ex-Partner: No    Emotionally Abused: No    Physically Abused: No    Sexually Abused: No    Medications:   Current Outpatient Medications on File Prior to Visit  Medication Sig Dispense Refill   aspirin EC 81 MG tablet Take 1 tablet (81 mg total) by mouth every 6 (six) hours as needed (high blood pressure). Swallow whole. 90 tablet 2   rosuvastatin (CRESTOR) 10 MG tablet Take 1 tablet (10 mg total) by mouth daily. 30 tablet 11   telmisartan (MICARDIS) 40 MG tablet Take 1 tablet (40 mg total) by mouth daily. 30 tablet 6   No current facility-administered medications on file prior to visit.    Allergies:   Allergies  Allergen Reactions   Penicillins Other (See Comments)    Child hood reaction    Physical Exam General: Obese middle-aged Caucasian male seated, in no evident distress Head: head normocephalic and atraumatic.   Neck: supple with no carotid or supraclavicular bruits Cardiovascular: regular rate and rhythm, no murmurs Musculoskeletal: no deformity Skin:  no rash/petichiae Vascular:  Normal pulses all extremities  Neurologic Exam Mental Status: Awake and fully alert. Oriented to place and time. Recent and remote memory intact. Attention span, concentration and fund of knowledge appropriate. Mood and affect appropriate.  Cranial Nerves: Fundoscopic exam reveals sharp disc margins. Pupils equal, briskly reactive to light. Extraocular movements full witho and gaze nystagmus on left lateral gaze in the left eye.. Visual fields full to confrontation. Hearing intact. Facial sensation intact. Face, tongue, palate moves normally and symmetrically.  Intermittent blepharospasm of the left eye with some twitching's on the left side of the face. Motor:  Normal bulk and tone. Normal strength in all tested extremity muscles. Sensory.: intact to touch , pinprick , position and vibratory sensation.  Coordination: Rapid alternating movements normal in all extremities. Finger-to-nose and heel-to-shin performed accurately bilaterally. Gait and Station: Arises from chair without difficulty. Stance is normal. Gait demonstrates normal stride length and balance . Able to heel, toe and tandem walk with great difficulty.  Reflexes: 1+ and symmetric. Toes downgoing.   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     PLAN:I had a long d/w patient and his sister about his recent pontine lacunar stroke, risk for recurrent stroke/TIAs, personally independently reviewed imaging studies and stroke evaluation results and answered questions.Continue aspirin 81 mg daily  for secondary stroke prevention and maintain  strict control of hypertension with blood pressure goal below 130/90, diabetes with hemoglobin A1c goal below 6.5% and lipids with LDL cholesterol goal below 70 mg/dL. I also advised the patient to eat a healthy diet with plenty of whole grains, cereals, fruits and vegetables, exercise regularly and maintain ideal body weight .I counseled the patient that he appears to be at high risk for obstructive sleep apnea to consider a polysomnogram but he is refusing this at the present time.  I also counseled the patient about not using cocaine and limiting alcohol usage.  Followup in the future with my nurse practitioner in 6 months or call earlier if necessary.  Greater than 50% time during this 45-minute consultation visit was spent in counseling and coordination of care about his lacunar stroke and discussion about stroke evaluation prevention treatment and answering questions.  Delia Heady,  MD Note: This document was prepared with digital dictation and possible smart phrase technology. Any transcriptional errors that result from this process are unintentional.

## 2023-04-04 NOTE — Patient Instructions (Signed)
 I had a long d/w patient and his sister about his recent pontine lacunar stroke, risk for recurrent stroke/TIAs, personally independently reviewed imaging studies and stroke evaluation results and answered questions.Continue aspirin 81 mg daily  for secondary stroke prevention and maintain strict control of hypertension with blood pressure goal below 130/90, diabetes with hemoglobin A1c goal below 6.5% and lipids with LDL cholesterol goal below 70 mg/dL. I also advised the patient to eat a healthy diet with plenty of whole grains, cereals, fruits and vegetables, exercise regularly and maintain ideal body weight .I counseled the patient that he appears to be at high risk for obstructive sleep apnea to consider a polysomnogram but he is refusing this at the present time.  Followup in the future with my nurse practitioner in 6 months or call earlier if necessary.  Stroke Prevention Some medical conditions and behaviors can lead to a higher chance of having a stroke. You can help prevent a stroke by eating healthy, exercising, not smoking, and managing any medical conditions you have. Stroke is a leading cause of functional impairment. Primary prevention is particularly important because a majority of strokes are first-time events. Stroke changes the lives of not only those who experience a stroke but also their family and other caregivers. How can this condition affect me? A stroke is a medical emergency and should be treated right away. A stroke can lead to brain damage and can sometimes be life-threatening. If a person gets medical treatment right away, there is a better chance of surviving and recovering from a stroke. What can increase my risk? The following medical conditions may increase your risk of a stroke: Cardiovascular disease. High blood pressure (hypertension). Diabetes. High cholesterol. Sickle cell disease. Blood clotting disorders (hypercoagulable state). Obesity. Sleep disorders  (obstructive sleep apnea). Other risk factors include: Being older than age 44. Having a history of blood clots, stroke, or mini-stroke (transient ischemic attack, TIA). Genetic factors, such as race, ethnicity, or a family history of stroke. Smoking cigarettes or using other tobacco products. Taking birth control pills, especially if you also use tobacco. Heavy use of alcohol or drugs, especially cocaine and methamphetamine. Physical inactivity. What actions can I take to prevent this? Manage your health conditions High cholesterol levels. Eating a healthy diet is important for preventing high cholesterol. If cholesterol cannot be managed through diet alone, you may need to take medicines. Take any prescribed medicines to control your cholesterol as told by your health care provider. Hypertension. To reduce your risk of stroke, try to keep your blood pressure below 130/80. Eating a healthy diet and exercising regularly are important for controlling blood pressure. If these steps are not enough to manage your blood pressure, you may need to take medicines. Take any prescribed medicines to control hypertension as told by your health care provider. Ask your health care provider if you should monitor your blood pressure at home. Have your blood pressure checked every year, even if your blood pressure is normal. Blood pressure increases with age and some medical conditions. Diabetes. Eating a healthy diet and exercising regularly are important parts of managing your blood sugar (glucose). If your blood sugar cannot be managed through diet and exercise, you may need to take medicines. Take any prescribed medicines to control your diabetes as told by your health care provider. Get evaluated for obstructive sleep apnea. Talk to your health care provider about getting a sleep evaluation if you snore a lot or have excessive sleepiness. Make sure that any other  medical conditions you have, such as  atrial fibrillation or atherosclerosis, are managed. Nutrition Follow instructions from your health care provider about what to eat or drink to help manage your health condition. These instructions may include: Reducing your daily calorie intake. Limiting how much salt (sodium) you use to 1,500 milligrams (mg) each day. Using only healthy fats for cooking, such as olive oil, canola oil, or sunflower oil. Eating healthy foods. You can do this by: Choosing foods that are high in fiber, such as whole grains, and fresh fruits and vegetables. Eating at least 5 servings of fruits and vegetables a day. Try to fill one-half of your plate with fruits and vegetables at each meal. Choosing lean protein foods, such as lean cuts of meat, poultry without skin, fish, tofu, beans, and nuts. Eating low-fat dairy products. Avoiding foods that are high in sodium. This can help lower blood pressure. Avoiding foods that have saturated fat, trans fat, and cholesterol. This can help prevent high cholesterol. Avoiding processed and prepared foods. Counting your daily carbohydrate intake.  Lifestyle If you drink alcohol: Limit how much you have to: 0-1 drink a day for women who are not pregnant. 0-2 drinks a day for men. Know how much alcohol is in your drink. In the [place] one drink equals one 12 oz bottle of beer ( ), one 5 oz glass of wine ( ), or one 1 oz glass of hard liquor (44mL). Do not use any products that contain nicotine or tobacco. These products include cigarettes, chewing tobacco, and vaping devices, such as e-cigarettes. If you need help quitting, ask your health care provider. Avoid secondhand smoke. Do not use drugs. Activity  Try to stay at a healthy weight. Get at least 30 minutes of exercise on most days, such as: Fast walking. Biking. Swimming. Medicines Take over-the-counter and prescription medicines only as told by your health care provider. Aspirin or blood thinners  (antiplatelets or anticoagulants) may be recommended to reduce your risk of forming blood clots that can lead to stroke. Avoid taking birth control pills. Talk to your health care provider about the risks of taking birth control pills if: You are over 11 years old. You smoke. You get very bad headaches. You have had a blood clot. Where to find more information American Stroke Association: www.strokeassociation.org Get help right away if: You or a loved one has any symptoms of a stroke. "BE FAST" is an easy way to remember the main warning signs of a stroke: B - Balance. Signs are dizziness, sudden trouble walking, or loss of balance. E - Eyes. Signs are trouble seeing or a sudden change in vision. F - Face. Signs are sudden weakness or numbness of the face, or the face or eyelid drooping on one side. A - Arms. Signs are weakness or numbness in an arm. This happens suddenly and usually on one side of the body. S - Speech. Signs are sudden trouble speaking, slurred speech, or trouble understanding what people say. T - Time. Time to call emergency services. Write down what time symptoms started. You or a loved one has other signs of a stroke, such as: A sudden, severe headache with no known cause. Nausea or vomiting. Seizure. These symptoms may represent a serious problem that is an emergency. Do not wait to see if the symptoms will go away. Get medical help right away. Call your local emergency services (911 in the [place] Do not drive yourself to the hospital. Summary You can help to prevent a  stroke by eating healthy, exercising, not smoking, limiting alcohol intake, and managing any medical conditions you may have. Do not use any products that contain nicotine or tobacco. These include cigarettes, chewing tobacco, and vaping devices, such as e-cigarettes. If you need help quitting, ask your health care provider. Remember "BE FAST" for warning signs of a stroke. Get help right away if you or a  loved one has any of these signs. This information is not intended to replace advice given to you by your health care provider. Make sure you discuss any questions you have with your health care provider. Document Revised: 12/06/2021 Document Reviewed: 12/06/2021 Elsevier Patient Education  2024 [employer].

## 2023-04-17 ENCOUNTER — Ambulatory Visit: Payer: Medicare Other | Admitting: Neurology

## 2023-04-25 ENCOUNTER — Other Ambulatory Visit: Payer: Self-pay

## 2023-04-26 ENCOUNTER — Other Ambulatory Visit: Payer: Self-pay | Admitting: Endocrinology

## 2023-04-26 DIAGNOSIS — E0590 Thyrotoxicosis, unspecified without thyrotoxic crisis or storm: Secondary | ICD-10-CM

## 2023-04-28 ENCOUNTER — Ambulatory Visit: Payer: Medicare Other | Admitting: Endocrinology

## 2023-05-01 ENCOUNTER — Ambulatory Visit: Payer: Self-pay | Admitting: Nurse Practitioner

## 2023-05-01 ENCOUNTER — Ambulatory Visit: Payer: Medicare Other | Admitting: Neurology

## 2023-05-04 ENCOUNTER — Ambulatory Visit: Payer: Medicare Other | Admitting: Cardiology

## 2023-05-15 ENCOUNTER — Other Ambulatory Visit

## 2023-05-16 LAB — T4, FREE: Free T4: 1.3 ng/dL (ref 0.8–1.8)

## 2023-05-16 LAB — T3, FREE: T3, Free: 4.2 pg/mL (ref 2.3–4.2)

## 2023-05-16 LAB — TSH: TSH: 0.03 m[IU]/L — ABNORMAL LOW (ref 0.40–4.50)

## 2023-05-18 ENCOUNTER — Encounter: Payer: Self-pay | Admitting: Endocrinology

## 2023-05-18 ENCOUNTER — Ambulatory Visit (INDEPENDENT_AMBULATORY_CARE_PROVIDER_SITE_OTHER): Admitting: Endocrinology

## 2023-05-18 VITALS — BP 126/70 | HR 113 | Resp 20 | Ht 70.0 in | Wt 242.4 lb

## 2023-05-18 DIAGNOSIS — E0590 Thyrotoxicosis, unspecified without thyrotoxic crisis or storm: Secondary | ICD-10-CM | POA: Diagnosis not present

## 2023-05-18 DIAGNOSIS — E042 Nontoxic multinodular goiter: Secondary | ICD-10-CM | POA: Diagnosis not present

## 2023-05-18 NOTE — Progress Notes (Signed)
 Outpatient Endocrinology Note [name], MD   Patient's Name: Greg Fry    DOB: March 22, 1955    MRN: [id number]  REASON OF VISIT: Follow up for hyperthyroidism / thyroid  goiter  REFERRING PROVIDER: Jerrlyn Morel, NP   PCP: Jerrlyn Morel, NP  HISTORY OF PRESENT ILLNESS:   Greg Fry is a 68 y.o. old male with past medical history as listed below is presented for follow up for subclinical hyperthyroidism and thyroid  goiter.   Pertinent Thyroid  History: Patient was hospitalized in January 2025 due to stroke, CT angiogram head and neck with IV iodine contrast on January 31, 2023 showed thyroid  goiter with intrathoracic extension, mainly left substernal goiter, CT images reviewed.  He has not had ultrasound thyroid  yet.  Patient has mildly low TSH, with normal free T4 and T3 consistent with subclinical hyperthyroidism.  He was noted to have low TSH from June 2024. In January 2025, when he had subclinical hyperthyroidism, he had CT scan with IV iodine contrast prior to the lab test.  Patient denies palpitation or heat intolerance.  No increased sweating.  Body weight is relatively stable.  He denies neck discomfort, dysphagia, change in voice or difficulty breathing.  He has never been on thyroid  medication.  Family history of thyroid  disorder in sister requiring thyroid  surgery for goiter, no thyroid  cancer.  Mother has goiter and also taking levothyroxine for hypothyroidism.  No radiation exposure to head and neck.  US  thyroid  : March 27, 2023 : Multiple thyroid  nodules mostly small bilateral with dominant left inferior thyroid  nodule measuring 3.4 cm mixed cystic/solid.  No high risk features.  Thyroid  autoantibodies were negative for Hashimoto thyroiditis and Graves' disease.  Interval history : Patient had ultrasound thyroid  in March reviewed images.  Patient had recent thyroid  function test with normal free T4 and free T3 with low TSH consistent with subclinical  hyperthyroidism.  Patient denies palpitation or heat intolerance.  No neck compressive symptoms, no neck discomfort and no dysphagia.  No difficulty breathing in any position.  Patient is accompanied by sister in the clinic today.   Latest Reference Range & Units 05/15/23 13:54  TSH 0.40 - 4.50 mIU/L 0.03 (L)  Triiodothyronine,Free,Serum 2.3 - 4.2 pg/mL 4.2  T4,Free(Direct) 0.8 - 1.8 ng/dL 1.3  (L): Data is abnormally low  REVIEW OF SYSTEMS:  As per history of present illness.   PAST MEDICAL HISTORY: Past Medical History:  Diagnosis Date   Dislocated shoulder    Hypertension    Stroke [facility])     PAST SURGICAL HISTORY: Past Surgical History:  Procedure Laterality Date   left shoulder dislocation Left    SHOULDER CLOSED REDUCTION Left 06/28/2013   Procedure: CLOSED REDUCTION LEFT SHOULDER UNDER ANESTHESIA;  Surgeon: Darrin Emerald, MD;  Location: [facility];  Service: Orthopedics;  Laterality: Left;    ALLERGIES: Allergies  Allergen Reactions   Penicillins Other (See Comments)    Child hood reaction    FAMILY HISTORY:  History reviewed. No pertinent family history.  SOCIAL HISTORY: Social History   Socioeconomic History   Marital status: Divorced    Spouse name: Not on file   Number of children: Not on file   Years of education: Not on file   Highest education level: Not on file  Occupational History   Not on file  Tobacco Use   Smoking status: Former    Types: Cigarettes   Smokeless tobacco: Not on file   Tobacco comments:    Stopped smoking  5 years ago.  Substance and Sexual Activity   Alcohol use: Yes    Comment: occasion   Drug use: Not Currently    Types: Cocaine, Marijuana    Comment: pt denies any use recently,    Sexual activity: Never  Other Topics Concern   Not on file  Social History Narrative   Not on file   Social Drivers of Health   Financial Resource Strain: Not on file  Food Insecurity: No Food Insecurity (02/16/2023)   Hunger Vital  Sign    Worried About Running Out of Food in the Last Year: Never true    Ran Out of Food in the Last Year: Never true  Transportation Needs: No Transportation Needs (02/16/2023)   PRAPARE - Administrator, Civil Service (Medical): No    Lack of Transportation (Non-Medical): No  Physical Activity: Not on file  Stress: Not on file  Social Connections: Moderately Integrated (01/29/2023)   Social Connection and Isolation Panel [NHANES]    Frequency of Communication with Friends and Family: More than three times a week    Frequency of Social Gatherings with Friends and Family: Twice a week    Attends Religious Services: More than 4 times per year    Active Member of [employer] or Organizations: No    Attends Banker Meetings: Never    Marital Status: Living with partner    MEDICATIONS:  Current Outpatient Medications  Medication Sig Dispense Refill   aspirin  EC 81 MG tablet Take 1 tablet (81 mg total) by mouth every 6 (six) hours as needed (high blood pressure). Swallow whole. 90 tablet 2   rosuvastatin  (CRESTOR ) 10 MG tablet Take 1 tablet (10 mg total) by mouth daily. 30 tablet 11   telmisartan  (MICARDIS ) 40 MG tablet Take 1 tablet (40 mg total) by mouth daily. 30 tablet 6   No current facility-administered medications for this visit.    PHYSICAL EXAM: Vitals:   05/18/23 1551  BP: 126/70  Pulse: (!) 113  Resp: 20  SpO2: 94%  Weight: 242 lb 6.4 oz (110 kg)  Height: 5\' 10"  (1.778 m)    Body mass index is 34.78 kg/m.  Wt Readings from Last 3 Encounters:  05/18/23 242 lb 6.4 oz (110 kg)  04/04/23 240 lb (108.9 kg)  03/24/23 235 lb 12.8 oz (107 kg)    General: Well developed, well nourished male in no apparent distress.  HEENT: AT/[place], no external lesions. Hearing intact to the spoken word Eyes: EOMI. No stare, proptosis or lid lag. Conjunctiva clear and no icterus. No erythema or watering Neck: Trachea midline, neck supple without appreciable thyromegaly or  lymphadenopathy and left palpable thyroid  nodule + Lungs: Clear to auscultation, no wheeze. Respirations not labored Heart: S1S2, Regular in rate and rhythm. No loud murmurs Abdomen: Soft, non tender, non distended Neurologic: Alert, oriented, normal speech, deep tendon biceps reflexes normal,  no gross focal neurological deficit Extremities: No pedal pitting edema, no tremors of outstretched hands Skin: Warm, color good.  Psychiatric: Does not appear depressed or anxious  PERTINENT HISTORIC LABORATORY AND IMAGING STUDIES:  All pertinent laboratory results were reviewed. Please see HPI also for further details.   TSH  Date Value Ref Range Status  05/15/2023 0.03 (L) 0.40 - 4.50 mIU/L Final  03/23/2023 0.06 (L) 0.40 - 4.50 mIU/L Final  02/08/2023 0.268 (L) 0.450 - 4.500 uIU/mL Final    Lab Results  Component Value Date   FREET4 1.3 05/15/2023   FREET4  1.5 03/23/2023   FREET4 1.16 (H) 01/30/2023   T3FREE 4.2 05/15/2023   T3FREE 4.3 (H) 03/23/2023   TSH 0.03 (L) 05/15/2023   TSH 0.06 (L) 03/23/2023   TSH 0.268 (L) 02/08/2023    No results found for: "THYROTRECAB"  Lab Results  Component Value Date   TSH 0.03 (L) 05/15/2023   TSH 0.06 (L) 03/23/2023   TSH 0.268 (L) 02/08/2023   FREET4 1.3 05/15/2023   FREET4 1.5 03/23/2023   FREET4 1.16 (H) 01/30/2023     Lab Results  Component Value Date   TSI <89 03/23/2023     No components found for: "TRAB"     Latest Reference Range & Units 03/23/23 14:21  Thyroglobulin Ab < or = 1 IU/mL <1  Thyroperoxidase Ab SerPl-aCnc <9 IU/mL <1  THYROID  STIMULATING IMMUNOGLOBULIN  Rpt  TSI <140 % baseline <89  TRAB <=2.00 IU/L <1.00  Rpt: View report in Results Review for more information    Latest Reference Range & Units 07/07/22 15:43 09/29/22 14:19 12/22/22 14:03 01/30/23 04:27 02/08/23 14:23  TSH 0.450 - 4.500 uIU/mL 0.375 (L) 0.301 (L) 0.339 (L) 0.475 0.268 (L)  T4,Free(Direct) 0.61 - 1.12 ng/dL    4.69 (H)   Thyroxine (T4) 4.5 -  12.0 ug/dL  8.9 9.4  9.7  Free Thyroxine Index 1.2 - 4.9   2.6 2.8  3.0  T3 Uptake Ratio 24 - 39 %  29 30  31   (L): Data is abnormally low (H): Data is abnormally high  ASSESSMENT / PLAN  1. Subclinical hyperthyroidism   2. Multinodular goiter    -Patient has incidental finding of thyroid  goiter with left substernal goiter/intrathoracic extension on CTA neck in January 2025.  Patient denies neck compressive symptoms.  Patient has lab results consistent with subclinical hyperthyroidism with low TSH at least from June 2024.  Patient had negative antibodies for Hashimoto thyroiditis and Graves' disease.  Ultrasound thyroid  in March 2025 showed bilateral multiple thyroid  nodules including dominant left inferior thyroid  nodule measuring 3.4 cm, is complex nodule.  Patient is clinically euthyroid, no hyperthyroid symptoms.  There is a possibility of hyperfunctioning thyroid  nodule to cause hyperthyroidism.  Plan: -Patient thyroid  function test with low TSH and normal free T4 and free T3.  No plan for thyroid  medication at this time.  Would like to monitor thyroid  function test.  If he continues to have low TSH consistent with subclinical hyperthyroidism, we will plan for radioactive iodine uptake and scan. -Check thyroid  function test TSH, free T4, free T3 in 3 months prior to follow-up visit. -Will monitor thyroid  nodules with serial ultrasound no plan for biopsy at this time.  Diagnoses and all orders for this visit:  Subclinical hyperthyroidism -     T3, free -     T4, free -     TSH  Multinodular goiter     DISPOSITION Follow up in clinic in 3 months suggested.  All questions answered and patient verbalized understanding of the plan.  [place] Cyra Spader, MD [facility] Endocrinology [facility] [address] [place], [address] [place], [place] [address] Phone # [phone]   At least part of this note was generated using voice recognition software. Inadvertent word errors may have  occurred, which were not recognized during the proofreading process.

## 2023-05-22 ENCOUNTER — Ambulatory Visit: Payer: Medicare Other | Admitting: Internal Medicine

## 2023-06-21 ENCOUNTER — Ambulatory Visit: Payer: Self-pay | Admitting: Nurse Practitioner

## 2023-06-26 ENCOUNTER — Ambulatory Visit: Payer: Medicare Other | Admitting: Cardiology

## 2023-08-24 ENCOUNTER — Other Ambulatory Visit

## 2023-08-24 LAB — T3, FREE: T3, Free: 4.3 pg/mL — ABNORMAL HIGH (ref 2.3–4.2)

## 2023-08-24 LAB — TSH: TSH: 0.07 m[IU]/L — ABNORMAL LOW (ref 0.40–4.50)

## 2023-08-24 LAB — T4, FREE: Free T4: 1.4 ng/dL (ref 0.8–1.8)

## 2023-08-25 ENCOUNTER — Ambulatory Visit: Payer: Self-pay | Admitting: Endocrinology

## 2023-08-30 ENCOUNTER — Encounter: Payer: Self-pay | Admitting: Endocrinology

## 2023-08-30 ENCOUNTER — Ambulatory Visit: Admitting: Endocrinology

## 2023-08-30 VITALS — BP 116/80 | HR 95 | Resp 20 | Ht 70.0 in | Wt 245.6 lb

## 2023-08-30 DIAGNOSIS — E042 Nontoxic multinodular goiter: Secondary | ICD-10-CM

## 2023-08-30 DIAGNOSIS — E0590 Thyrotoxicosis, unspecified without thyrotoxic crisis or storm: Secondary | ICD-10-CM | POA: Diagnosis not present

## 2023-08-30 NOTE — Progress Notes (Signed)
 Outpatient Endocrinology Note [place] Anhad Sheeley, MD   Patient's Name: Greg Fry    DOB: [birth year]    MRN: [id number]  REASON OF VISIT: Follow up for hyperthyroidism / thyroid  goiter  REFERRING PROVIDER: Oley Bascom RAMAN, NP  PCP: Oley Bascom RAMAN, NP  HISTORY OF PRESENT ILLNESS:   Greg Fry is a 68 y.o. old male with past medical history as listed below is presented for follow up for hyperthyroidism and thyroid  goiter.   Pertinent Thyroid  History: Patient was hospitalized in January 2025 due to stroke, CT angiogram head and neck with IV iodine contrast on January 31, 2023 showed thyroid  goiter with intrathoracic extension, mainly left substernal goiter, CT images reviewed.  He has not had ultrasound thyroid  yet.  Patient has mildly low TSH, with normal free T4 and T3 consistent with subclinical hyperthyroidism.  He was noted to have low TSH from June 2024. In January 2025, when he had subclinical hyperthyroidism, he had CT scan with IV iodine contrast prior to the lab test.  Patient denies palpitation or heat intolerance.  No increased sweating.  Body weight is relatively stable.  He denies neck discomfort, dysphagia, change in voice or difficulty breathing.  He has never been on thyroid  medication.  Family history of thyroid  disorder in sister requiring thyroid  surgery for goiter, no thyroid  cancer.  Mother has goiter and also taking levothyroxine for hypothyroidism.  No radiation exposure to head and neck.  US  thyroid  : March 27, 2023 : Multiple thyroid  nodules mostly small bilateral with dominant left inferior thyroid  nodule measuring 3.4 cm mixed cystic/solid.  No high risk features.  Thyroid  autoantibodies were negative for Hashimoto thyroiditis and Graves' disease.  Interval history : Patient denies any complaint.  He denies palpitation or heat intolerance.  He denies any neck compressive symptoms, dysphagia or difficulty breathing in any position.  He had recent thyroid   function test with low TSH and mildly elevated free T3 with normal free T4.  He is accompanied by sister in the clinic today.  No recent IV iodine contrast use.   REVIEW OF SYSTEMS:  As per history of present illness.   PAST MEDICAL HISTORY: Past Medical History:  Diagnosis Date   Dislocated shoulder    Hypertension    Stroke [facility])     PAST SURGICAL HISTORY: Past Surgical History:  Procedure Laterality Date   left shoulder dislocation Left    SHOULDER CLOSED REDUCTION Left 06/28/2013   Procedure: CLOSED REDUCTION LEFT SHOULDER UNDER ANESTHESIA;  Surgeon: Taft FORBES Minerva, MD;  Location: [facility];  Service: Orthopedics;  Laterality: Left;    ALLERGIES: Allergies  Allergen Reactions   Penicillins Other (See Comments)    Child hood reaction    FAMILY HISTORY:  History reviewed. No pertinent family history.  SOCIAL HISTORY: Social History   Socioeconomic History   Marital status: Divorced    Spouse name: Not on file   Number of children: Not on file   Years of education: Not on file   Highest education level: Not on file  Occupational History   Not on file  Tobacco Use   Smoking status: Former    Types: Cigarettes   Smokeless tobacco: Not on file   Tobacco comments:    Stopped smoking 5 years ago.  Substance and Sexual Activity   Alcohol use: Yes    Comment: occasion   Drug use: Not Currently    Types: Cocaine, Marijuana    Comment: pt denies any use recently,  Sexual activity: Never  Other Topics Concern   Not on file  Social History Narrative   Not on file   Social Drivers of Health   Financial Resource Strain: Not on file  Food Insecurity: No Food Insecurity (02/16/2023)   Hunger Vital Sign    Worried About Running Out of Food in the Last Year: Never true    Ran Out of Food in the Last Year: Never true  Transportation Needs: No Transportation Needs (02/16/2023)   PRAPARE - Administrator, Civil Service (Medical): No    Lack of  Transportation (Non-Medical): No  Physical Activity: Not on file  Stress: Not on file  Social Connections: Moderately Integrated (01/29/2023)   Social Connection and Isolation Panel    Frequency of Communication with Friends and Family: More than three times a week    Frequency of Social Gatherings with Friends and Family: Twice a week    Attends Religious Services: More than 4 times per year    Active Member of [employer] or Organizations: No    Attends Banker Meetings: Never    Marital Status: Living with partner    MEDICATIONS:  Current Outpatient Medications  Medication Sig Dispense Refill   aspirin  EC 81 MG tablet Take 1 tablet (81 mg total) by mouth every 6 (six) hours as needed (high blood pressure). Swallow whole. 90 tablet 2   rosuvastatin  (CRESTOR ) 10 MG tablet Take 1 tablet (10 mg total) by mouth daily. 30 tablet 11   telmisartan  (MICARDIS ) 40 MG tablet Take 1 tablet (40 mg total) by mouth daily. 30 tablet 6   No current facility-administered medications for this visit.    PHYSICAL EXAM: Vitals:   08/30/23 1337  BP: 116/80  Pulse: 95  Resp: 20  SpO2: 96%  Weight: 245 lb 9.6 oz (111.4 kg)  Height: 5' 10 (1.778 m)    Body mass index is 35.24 kg/m.  Wt Readings from Last 3 Encounters:  08/30/23 245 lb 9.6 oz (111.4 kg)  05/18/23 242 lb 6.4 oz (110 kg)  04/04/23 240 lb (108.9 kg)    General: Well developed, well nourished male in no apparent distress.  HEENT: AT/[place], no external lesions. Hearing intact to the spoken word Eyes: EOMI. No stare, proptosis or lid lag. Conjunctiva clear and no icterus. No erythema or watering Neck: Trachea midline, neck supple without appreciable thyromegaly or lymphadenopathy and left palpable thyroid  nodule + Lungs: Clear to auscultation, no wheeze. Respirations not labored Heart: S1S2, Regular in rate and rhythm. No loud murmurs Abdomen: Soft, non tender, non distended Neurologic: Alert, oriented, normal speech, deep  tendon biceps reflexes normal,  no gross focal neurological deficit Extremities: No pedal pitting edema, no tremors of outstretched hands Skin: Warm, color good.  Psychiatric: Does not appear depressed or anxious  PERTINENT HISTORIC LABORATORY AND IMAGING STUDIES:  All pertinent laboratory results were reviewed. Please see HPI also for further details.   TSH  Date Value Ref Range Status  08/24/2023 0.07 (L) 0.40 - 4.50 mIU/L Final  05/15/2023 0.03 (L) 0.40 - 4.50 mIU/L Final  03/23/2023 0.06 (L) 0.40 - 4.50 mIU/L Final    Lab Results  Component Value Date   FREET4 1.4 08/24/2023   FREET4 1.3 05/15/2023   FREET4 1.5 03/23/2023   T3FREE 4.3 (H) 08/24/2023   T3FREE 4.2 05/15/2023   T3FREE 4.3 (H) 03/23/2023   TSH 0.07 (L) 08/24/2023   TSH 0.03 (L) 05/15/2023   TSH 0.06 (L) 03/23/2023  No results found for: [facility]  Lab Results  Component Value Date   TSH 0.07 (L) 08/24/2023   TSH 0.03 (L) 05/15/2023   TSH 0.06 (L) 03/23/2023   FREET4 1.4 08/24/2023   FREET4 1.3 05/15/2023   FREET4 1.5 03/23/2023     Lab Results  Component Value Date   TSI <89 03/23/2023     No components found for: TRAB     Latest Reference Range & Units 03/23/23 14:21  Thyroglobulin Ab < or = 1 IU/mL <1  Thyroperoxidase Ab SerPl-aCnc <9 IU/mL <1  THYROID  STIMULATING IMMUNOGLOBULIN  Rpt  TSI <140 % baseline <89  TRAB <=2.00 IU/L <1.00  Rpt: View report in Results Review for more information    Latest Reference Range & Units 07/07/22 15:43 09/29/22 14:19 12/22/22 14:03 01/30/23 04:27 02/08/23 14:23  TSH 0.450 - 4.500 uIU/mL 0.375 (L) 0.301 (L) 0.339 (L) 0.475 0.268 (L)  T4,Free(Direct) 0.61 - 1.12 ng/dL    8.83 (H)   Thyroxine (T4) 4.5 - 12.0 ug/dL  8.9 9.4  9.7  Free Thyroxine Index 1.2 - 4.9   2.6 2.8  3.0  T3 Uptake Ratio 24 - 39 %  29 30  31   (L): Data is abnormally low (H): Data is abnormally high  ASSESSMENT / PLAN  1. Subclinical hyperthyroidism   2. Multinodular goiter     -Patient has incidental finding of thyroid  goiter with left substernal goiter/intrathoracic extension on CTA neck in January 2025.  Patient denies neck compressive symptoms.  Patient has lab results consistent with subclinical hyperthyroidism with low TSH at least from June 2024.  Patient had negative antibodies for Hashimoto thyroiditis and Graves' disease.  Ultrasound thyroid  in March 2025 showed bilateral multiple thyroid  nodules including dominant left inferior thyroid  nodule measuring 3.4 cm, is complex nodule.  Patient is clinically euthyroid, no hyperthyroid symptoms.  There is a possibility of hyperfunctioning thyroid  nodule to cause hyperthyroidism. - He has intermittently low TSH.  Recently he had mildly elevated free T3 with low TSH and normal free T4.  Plan: -Discussed and recommended about radioactive iodine nuclear uptake and scan to evaluate for hyperthyroidism possibly caused hyperfunctioning nodule.  Patient do not want to do the test at this time.  He wants to think about it and let us  know.  Patient's sister will help with the process. -Since he does not have hyperthyroid symptoms no plan to start on antithyroid medication at this time. -Will continue to monitor thyroid  hormone levels closely and regularly. -Will monitor thyroid  nodules with serial ultrasound, no plan for biopsy at this time.  Diagnoses and all orders for this visit:  Subclinical hyperthyroidism -     T4, free -     T3, free -     TSH  Multinodular goiter -     T4, free -     T3, free -     TSH   DISPOSITION Follow up in clinic in 3 months suggested.  Labs prior to follow-up visit.  All questions answered and patient verbalized understanding of the plan.  [place] Rukiya Hodgkins, MD [facility] [address], [address] [place], [place] [address] Phone # [phone]   At least part of this note was generated using voice recognition software. Inadvertent word errors may  have occurred, which were not recognized during the proofreading process.

## 2023-08-30 NOTE — Progress Notes (Signed)
 Outpatient Endocrinology Note [place] Anhad Sheeley, MD   Patient's Name: Greg Fry    DOB: [birth year]    MRN: [id number]  REASON OF VISIT: Follow up for hyperthyroidism / thyroid  goiter  REFERRING PROVIDER: Oley Bascom RAMAN, NP  PCP: Oley Bascom RAMAN, NP  HISTORY OF PRESENT ILLNESS:   Greg Fry is a 68 y.o. old male with past medical history as listed below is presented for follow up for hyperthyroidism and thyroid  goiter.   Pertinent Thyroid  History: Patient was hospitalized in January 2025 due to stroke, CT angiogram head and neck with IV iodine contrast on January 31, 2023 showed thyroid  goiter with intrathoracic extension, mainly left substernal goiter, CT images reviewed.  He has not had ultrasound thyroid  yet.  Patient has mildly low TSH, with normal free T4 and T3 consistent with subclinical hyperthyroidism.  He was noted to have low TSH from June 2024. In January 2025, when he had subclinical hyperthyroidism, he had CT scan with IV iodine contrast prior to the lab test.  Patient denies palpitation or heat intolerance.  No increased sweating.  Body weight is relatively stable.  He denies neck discomfort, dysphagia, change in voice or difficulty breathing.  He has never been on thyroid  medication.  Family history of thyroid  disorder in sister requiring thyroid  surgery for goiter, no thyroid  cancer.  Mother has goiter and also taking levothyroxine for hypothyroidism.  No radiation exposure to head and neck.  US  thyroid  : March 27, 2023 : Multiple thyroid  nodules mostly small bilateral with dominant left inferior thyroid  nodule measuring 3.4 cm mixed cystic/solid.  No high risk features.  Thyroid  autoantibodies were negative for Hashimoto thyroiditis and Graves' disease.  Interval history : Patient denies any complaint.  He denies palpitation or heat intolerance.  He denies any neck compressive symptoms, dysphagia or difficulty breathing in any position.  He had recent thyroid   function test with low TSH and mildly elevated free T3 with normal free T4.  He is accompanied by sister in the clinic today.  No recent IV iodine contrast use.   REVIEW OF SYSTEMS:  As per history of present illness.   PAST MEDICAL HISTORY: Past Medical History:  Diagnosis Date   Dislocated shoulder    Hypertension    Stroke Greg Fry)     PAST SURGICAL HISTORY: Past Surgical History:  Procedure Laterality Date   left shoulder dislocation Left    SHOULDER CLOSED REDUCTION Left 06/28/2013   Procedure: CLOSED REDUCTION LEFT SHOULDER UNDER ANESTHESIA;  Surgeon: Taft FORBES Minerva, MD;  Location: [facility];  Service: Orthopedics;  Laterality: Left;    ALLERGIES: Allergies  Allergen Reactions   Penicillins Other (See Comments)    Child hood reaction    FAMILY HISTORY:  History reviewed. No pertinent family history.  SOCIAL HISTORY: Social History   Socioeconomic History   Marital status: Divorced    Spouse name: Not on file   Number of children: Not on file   Years of education: Not on file   Highest education level: Not on file  Occupational History   Not on file  Tobacco Use   Smoking status: Former    Types: Cigarettes   Smokeless tobacco: Not on file   Tobacco comments:    Stopped smoking 5 years ago.  Substance and Sexual Activity   Alcohol use: Yes    Comment: occasion   Drug use: Not Currently    Types: Cocaine, Marijuana    Comment: pt denies any use recently,  Sexual activity: Never  Other Topics Concern   Not on file  Social History Narrative   Not on file   Social Drivers of Health   Financial Resource Strain: Not on file  Food Insecurity: No Food Insecurity (02/16/2023)   Hunger Vital Sign    Worried About Running Out of Food in the Last Year: Never true    Ran Out of Food in the Last Year: Never true  Transportation Needs: No Transportation Needs (02/16/2023)   PRAPARE - Administrator, Civil Service (Medical): No    Lack of  Transportation (Non-Medical): No  Physical Activity: Not on file  Stress: Not on file  Social Connections: Moderately Integrated (01/29/2023)   Social Connection and Isolation Panel    Frequency of Communication with Friends and Family: More than three times a week    Frequency of Social Gatherings with Friends and Family: Twice a week    Attends Religious Services: More than 4 times per year    Active Member of [employer] or Organizations: No    Attends Banker Meetings: Never    Marital Status: Living with partner    MEDICATIONS:  Current Outpatient Medications  Medication Sig Dispense Refill   aspirin  EC 81 MG tablet Take 1 tablet (81 mg total) by mouth every 6 (six) hours as needed (high blood pressure). Swallow whole. 90 tablet 2   rosuvastatin  (CRESTOR ) 10 MG tablet Take 1 tablet (10 mg total) by mouth daily. 30 tablet 11   telmisartan  (MICARDIS ) 40 MG tablet Take 1 tablet (40 mg total) by mouth daily. 30 tablet 6   No current facility-administered medications for this visit.    PHYSICAL EXAM: Vitals:   08/30/23 1337  BP: 116/80  Pulse: 95  Resp: 20  SpO2: 96%  Weight: 245 lb 9.6 oz (111.4 kg)  Height: 5' 10 (1.778 m)    Body mass index is 35.24 kg/m.  Wt Readings from Last 3 Encounters:  08/30/23 245 lb 9.6 oz (111.4 kg)  05/18/23 242 lb 6.4 oz (110 kg)  04/04/23 240 lb (108.9 kg)    General: Well developed, well nourished male in no apparent distress.  HEENT: AT/[place], no external lesions. Hearing intact to the spoken word Eyes: EOMI. No stare, proptosis or lid lag. Conjunctiva clear and no icterus. No erythema or watering Neck: Trachea midline, neck supple without appreciable thyromegaly or lymphadenopathy and left palpable thyroid  nodule + Lungs: Clear to auscultation, no wheeze. Respirations not labored Heart: S1S2, Regular in rate and rhythm. No loud murmurs Abdomen: Soft, non tender, non distended Neurologic: Alert, oriented, normal speech, deep  tendon biceps reflexes normal,  no gross focal neurological deficit Extremities: No pedal pitting edema, no tremors of outstretched hands Skin: Warm, color good.  Psychiatric: Does not appear depressed or anxious  PERTINENT HISTORIC LABORATORY AND IMAGING STUDIES:  All pertinent laboratory results were reviewed. Please see HPI also for further details.   TSH  Date Value Ref Range Status  08/24/2023 0.07 (L) 0.40 - 4.50 mIU/L Final  05/15/2023 0.03 (L) 0.40 - 4.50 mIU/L Final  03/23/2023 0.06 (L) 0.40 - 4.50 mIU/L Final    Lab Results  Component Value Date   FREET4 1.4 08/24/2023   FREET4 1.3 05/15/2023   FREET4 1.5 03/23/2023   T3FREE 4.3 (H) 08/24/2023   T3FREE 4.2 05/15/2023   T3FREE 4.3 (H) 03/23/2023   TSH 0.07 (L) 08/24/2023   TSH 0.03 (L) 05/15/2023   TSH 0.06 (L) 03/23/2023  No results found for: [facility]  Lab Results  Component Value Date   TSH 0.07 (L) 08/24/2023   TSH 0.03 (L) 05/15/2023   TSH 0.06 (L) 03/23/2023   FREET4 1.4 08/24/2023   FREET4 1.3 05/15/2023   FREET4 1.5 03/23/2023     Lab Results  Component Value Date   TSI <89 03/23/2023     No components found for: TRAB     Latest Reference Range & Units 03/23/23 14:21  Thyroglobulin Ab < or = 1 IU/mL <1  Thyroperoxidase Ab SerPl-aCnc <9 IU/mL <1  THYROID  STIMULATING IMMUNOGLOBULIN  Rpt  TSI <140 % baseline <89  TRAB <=2.00 IU/L <1.00  Rpt: View report in Results Review for more information    Latest Reference Range & Units 07/07/22 15:43 09/29/22 14:19 12/22/22 14:03 01/30/23 04:27 02/08/23 14:23  TSH 0.450 - 4.500 uIU/mL 0.375 (L) 0.301 (L) 0.339 (L) 0.475 0.268 (L)  T4,Free(Direct) 0.61 - 1.12 ng/dL    8.83 (H)   Thyroxine (T4) 4.5 - 12.0 ug/dL  8.9 9.4  9.7  Free Thyroxine Index 1.2 - 4.9   2.6 2.8  3.0  T3 Uptake Ratio 24 - 39 %  29 30  31   (L): Data is abnormally low (H): Data is abnormally high  ASSESSMENT / PLAN  1. Subclinical hyperthyroidism   2. Multinodular goiter     -Patient has incidental finding of thyroid  goiter with left substernal goiter/intrathoracic extension on CTA neck in January 2025.  Patient denies neck compressive symptoms.  Patient has lab results consistent with subclinical hyperthyroidism with low TSH at least from June 2024.  Patient had negative antibodies for Hashimoto thyroiditis and Graves' disease.  Ultrasound thyroid  in March 2025 showed bilateral multiple thyroid  nodules including dominant left inferior thyroid  nodule measuring 3.4 cm, is complex nodule.  Patient is clinically euthyroid, no hyperthyroid symptoms.  There is a possibility of hyperfunctioning thyroid  nodule to cause hyperthyroidism. - He has intermittently low TSH.  Recently he had mildly elevated free T3 with low TSH and normal free T4.  Plan: -Discussed and recommended about radioactive iodine nuclear uptake and scan to evaluate for hyperthyroidism possibly caused hyperfunctioning nodule.  Patient do not want to do the test at this time.  He wants to think about it and let us  know.  Patient's sister will help with the process. -Since he does not have hyperthyroid symptoms no plan to start on antithyroid medication at this time. -Will continue to monitor thyroid  hormone levels closely and regularly. -Will monitor thyroid  nodules with serial ultrasound, no plan for biopsy at this time.  Diagnoses and all orders for this visit:  Subclinical hyperthyroidism -     T4, free -     T3, free -     TSH  Multinodular goiter -     T4, free -     T3, free -     TSH   DISPOSITION Follow up in clinic in 3 months suggested.  Labs prior to follow-up visit.  All questions answered and patient verbalized understanding of the plan.  Greg Rukiya Hodgkins, MD [facility] [address], [address] [place], [place] [address] Phone # [phone]   At least part of this note was generated using voice recognition software. Inadvertent word errors may  have occurred, which were not recognized during the proofreading process.

## 2023-10-10 NOTE — Progress Notes (Signed)
 " Cardiology Office Note    Patient Name: Greg Fry Date of Encounter: 10/10/2023  Primary Care Provider:  Oley Bascom RAMAN, NP Primary Cardiologist:  None Primary Electrophysiologist: None   Past Medical History    Past Medical History:  Diagnosis Date   Dislocated shoulder    Hypertension    Stroke [facility])     History of Present Illness  Greg Fry is a 68 y.o. 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  [name] has a past medical history significant for acute CVA posterior pons on 01/29/2023 with small vessel microvascular disease.  CTA of the head and neck which showed high-grade carotid stenosis and was placed on ASA and Plavix .  He was placed on low-dose statin and wore a 30-day event monitor that showed no evidence of atrial fibrillation.  2D echo was completed with bubble study that showed EF of 70 to 75% moderate LVH with trivial MVR mild calcification of the aortic valve mild aortic dilation with no intra atrial shunt present.  He was seen by Dr. Wendel on 03/24/2023 and during visit reported no signs and symptoms of stroke or chest pain.  He had well-controlled BP during visit but noted elevated BP with systolics in the 140-150 range.  Had amlodipine  discontinued and was started on Micardis  due to diastolic dysfunction.  He was continued on Crestor  and ASA 81 mg and recently was seen by endocrinology for evaluation of thyroid  goiter.  He was offered radioactive iodine nuclear uptake scan but declined at this time.  Mr. [name] presents today for 56-month follow-up with his sister. He experienced a stroke in January 2025 and has been under follow-up care since then. A 30-day cardiac monitor did not reveal atrial fibrillation, and an echocardiogram was negative for any cardiac anomalies. He has not experienced any residual symptoms from the  stroke. He is on Crestor  and aspirin  to manage cholesterol and prevent stroke recurrence. He was previously on amlodipine  for blood pressure management, which was switched to telmisartan  40 mg.  Due to elevated blood pressure readings at home. He experiences occasional dizziness but no cramps or hyperkalemia. His blood pressure at home has been around 116/60 and was low today at 98/62. He experiences anxiety, particularly during medical visits, which he believes contributes to an elevated heart rate. His heart rate was 113 in May and 117 today. No palpitations or skipped beats. He has not engaged in much physical activity recently, although he occasionally does yard work. He has lost interest in activities he previously enjoyed, such as walking. He was previously prescribed clonazepam for anxiety, which he discontinued after experiencing nausea and vomiting. He enjoys watching westerns, which helps him relax.  During today's visit he experienced a vasovagal episode with blood pressure dropping to 84/62 and heart rate dropping as low as 32 bpm.  He was evaluated by our DOD Dr. Ladona who recommended that he drink 2 cups of water and wait for 10 minutes and if BP response he would be fine to leave the office today.  He was provided water and O2 with good response and return to stable BP readings of 100/70 prior to leaving the office.  Discussed the use of AI scribe software for clinical note transcription with the patient, who gave verbal consent to proceed.  History of Present Illness    Review of Systems  Please see the history of present illness.    All other systems reviewed and are otherwise negative except as noted above.  Physical Exam    Wt Readings from Last 3 Encounters:  08/30/23 245 lb 9.6 oz (111.4 kg)  05/18/23 242 lb 6.4 oz (110 kg)  04/04/23 240 lb (108.9 kg)   CD:Uyzmz were no vitals filed for this visit.,There is no height or weight on file to calculate BMI. GEN: Well nourished,  well developed in no acute distress Neck: No JVD; bilateral +1 carotid bruit Pulmonary: Clear to auscultation without rales, wheezing or rhonchi  Cardiovascular: Normal rate. Regular rhythm. Normal S1. Normal S2.  Vasovagal response  Murmurs: There is no murmur.  ABDOMEN: Soft, non-tender, non-distended EXTREMITIES:  No edema; No deformity   EKG/LABS/ Recent Cardiac Studies   ECG personally reviewed by me today -sinus rhythm with a rate of 69 bpm and no acute changes consistent with previous EKG with left axis deviation.  Risk Assessment/Calculations:          Lab Results  Component Value Date   WBC 9.4 02/08/2023   HGB 16.8 02/08/2023   HCT 48.1 02/08/2023   MCV 92 02/08/2023   PLT 279 02/08/2023   Lab Results  Component Value Date   CREATININE 1.09 03/31/2023   BUN 16 03/31/2023   NA 141 03/31/2023   K 4.5 03/31/2023   CL 106 03/31/2023   CO2 18 (L) 03/31/2023   Lab Results  Component Value Date   CHOL 110 01/31/2023   HDL 34 (L) 01/31/2023   LDLCALC 51 01/31/2023   TRIG 124 01/31/2023   CHOLHDL 3.2 01/31/2023    Lab Results  Component Value Date   HGBA1C 5.8 (H) 01/31/2023   Assessment & Plan    Assessment & Plan  1.  Symptomatic bradycardia and hypotension:  likely medication-induced with BP dropping to the 70s systolically. Acute episode likely due to blood pressure medication, exacerbated by empty stomach and possible vagal response.  -Heart rate improved with supportive care. - Call EMS for evaluation and potential transport over symptoms improved and patient elected to leave with his sister. - Administer oxygen at 2 L/min. - Provide supportive care including hydration and monitoring. -Orthostatic vitals completed and were normal.  (See readings under vital signs tab) - We will check BMET today and reassess BP medications with possible decrease in dose.  2.  Essential hypertension: -Blood pressure well-controlled but recent hypotension suggests  possible overmedication. Home BP averages 116-120 mmHg. - Check blood pressure at home two hours after medication intake. - Reassess blood pressure medication dosage after lab results. -We will check BMET today to assess for hyperkalemia and renal function. - Continue telmisartan  40 mg daily for now  3.  Hyperlipidemia: -Patient's last LDL cholesterol was 51 at goal -Crestor  10 mg daily  4.  Carotid artery disease: Mild carotid atherosclerosis without significant stenosis.  - Continue current GDMT with ASA 81 mg and Crestor  10 mg   5.  History of CVA: -Stroke in January 2025 with no residual symptoms. - Continue Crestor  10 mg, ASA 81 mg and telmisartan  40 mg  Disposition: Follow-up with Dr. Wendel or APP in 6 months    Signed, Wyn Raddle, Jackee Shove, NP 10/10/2023, 5:46 PM  [facility] "

## 2023-10-11 ENCOUNTER — Ambulatory Visit: Attending: Cardiology | Admitting: Nurse Practitioner

## 2023-10-11 ENCOUNTER — Encounter: Payer: Self-pay | Admitting: Nurse Practitioner

## 2023-10-11 VITALS — BP 121/70 | HR 95 | Ht 70.0 in | Wt 246.8 lb

## 2023-10-11 DIAGNOSIS — I6523 Occlusion and stenosis of bilateral carotid arteries: Secondary | ICD-10-CM | POA: Insufficient documentation

## 2023-10-11 DIAGNOSIS — R000 Tachycardia, unspecified: Secondary | ICD-10-CM | POA: Insufficient documentation

## 2023-10-11 DIAGNOSIS — E785 Hyperlipidemia, unspecified: Secondary | ICD-10-CM | POA: Diagnosis present

## 2023-10-11 DIAGNOSIS — I5189 Other ill-defined heart diseases: Secondary | ICD-10-CM | POA: Insufficient documentation

## 2023-10-11 DIAGNOSIS — I639 Cerebral infarction, unspecified: Secondary | ICD-10-CM | POA: Diagnosis not present

## 2023-10-11 DIAGNOSIS — I10 Essential (primary) hypertension: Secondary | ICD-10-CM | POA: Diagnosis not present

## 2023-10-11 LAB — BASIC METABOLIC PANEL WITH GFR
BUN/Creatinine Ratio: 10 (ref 10–24)
BUN: 15 mg/dL (ref 8–27)
CO2: 21 mmol/L (ref 20–29)
Calcium: 9.2 mg/dL (ref 8.6–10.2)
Chloride: 103 mmol/L (ref 96–106)
Creatinine, Ser: 1.46 mg/dL — ABNORMAL HIGH (ref 0.76–1.27)
Glucose: 150 mg/dL — ABNORMAL HIGH (ref 70–99)
Potassium: 4.7 mmol/L (ref 3.5–5.2)
Sodium: 138 mmol/L (ref 134–144)
eGFR: 53 mL/min/1.73 — ABNORMAL LOW (ref >59)

## 2023-10-11 NOTE — Patient Instructions (Signed)
 Medication Instructions:  Your physician recommends that you continue on your current medications as directed. Please refer to the Current Medication list given to you today. *If you need a refill on your cardiac medications before your next appointment, please call your pharmacy*  Lab Work: TODAY-BMET If you have labs (blood work) drawn today and your tests are completely normal, you will receive your results only by: MyChart Message (if you have MyChart) OR A paper copy in the mail If you have any lab test that is abnormal or we need to change your treatment, we will call you to review the results.  Testing/Procedures: NONE ORDERED  Follow-Up: At [facility], you and your health needs are our priority.  As part of our continuing mission to provide you with exceptional heart care, our providers are all part of one team.  This team includes your primary Cardiologist (physician) and Advanced Practice Providers or APPs (Physician Assistants and Nurse Practitioners) who all work together to provide you with the care you need, when you need it.  Your next appointment:   6 month(s)  Provider:   Lurena Red, MD    We recommend signing up for the patient portal called MyChart.  Sign up information is provided on this After Visit Summary.  MyChart is used to connect with patients for Virtual Visits (Telemedicine).  Patients are able to view lab/test results, encounter notes, upcoming appointments, etc.  Non-urgent messages can be sent to your provider as well.   To learn more about what you can do with MyChart, go to [email or web address].   Other Instructions

## 2023-10-12 ENCOUNTER — Other Ambulatory Visit: Payer: Self-pay

## 2023-10-12 ENCOUNTER — Ambulatory Visit: Payer: Self-pay | Admitting: Nurse Practitioner

## 2023-10-12 DIAGNOSIS — I10 Essential (primary) hypertension: Secondary | ICD-10-CM

## 2023-10-12 MED ORDER — LOSARTAN POTASSIUM 50 MG PO TABS
50.0000 mg | ORAL_TABLET | Freq: Every day | ORAL | 1 refills | Status: DC
Start: 2023-10-12 — End: 2023-11-29

## 2023-10-16 ENCOUNTER — Other Ambulatory Visit: Payer: Self-pay | Admitting: Internal Medicine

## 2023-10-25 ENCOUNTER — Telehealth: Payer: Self-pay | Admitting: Endocrinology

## 2023-10-25 NOTE — Telephone Encounter (Signed)
 Patient's sister called and they are requesting to do pre-labs in [place] instead of coming to [place].  Please send lab orders to [facility], [address] [place], [place] Ph: [phone] Fax: [phone].   Patient also requests that lab orders be mailed to address on file.

## 2023-10-26 NOTE — Telephone Encounter (Signed)
 Lab orders faxed and mailed as requested

## 2023-10-27 ENCOUNTER — Ambulatory Visit: Payer: Self-pay | Admitting: Nurse Practitioner

## 2023-10-27 LAB — BASIC METABOLIC PANEL WITH GFR
BUN/Creatinine Ratio: 15 (ref 10–24)
BUN: 15 mg/dL (ref 8–27)
CO2: 19 mmol/L — ABNORMAL LOW (ref 20–29)
Calcium: 9.3 mg/dL (ref 8.6–10.2)
Chloride: 104 mmol/L (ref 96–106)
Creatinine, Ser: 1.01 mg/dL (ref 0.76–1.27)
Glucose: 115 mg/dL — ABNORMAL HIGH (ref 70–99)
Potassium: 4.1 mmol/L (ref 3.5–5.2)
Sodium: 139 mmol/L (ref 134–144)
eGFR: 82 mL/min/1.73 (ref >59)

## 2023-11-23 ENCOUNTER — Other Ambulatory Visit: Payer: Self-pay | Admitting: Endocrinology

## 2023-11-24 ENCOUNTER — Other Ambulatory Visit

## 2023-11-24 ENCOUNTER — Ambulatory Visit: Payer: Self-pay | Admitting: Endocrinology

## 2023-11-24 LAB — TSH: TSH: 0.137 u[IU]/mL — ABNORMAL LOW (ref 0.450–4.500)

## 2023-11-24 LAB — T4, FREE: Free T4: 1.41 ng/dL (ref 0.82–1.77)

## 2023-11-24 LAB — T3, FREE: T3, Free: 3.6 pg/mL (ref 2.0–4.4)

## 2023-11-29 ENCOUNTER — Encounter: Payer: Self-pay | Admitting: Endocrinology

## 2023-11-29 ENCOUNTER — Ambulatory Visit (INDEPENDENT_AMBULATORY_CARE_PROVIDER_SITE_OTHER): Admitting: Endocrinology

## 2023-11-29 ENCOUNTER — Telehealth: Payer: Self-pay | Admitting: Nurse Practitioner

## 2023-11-29 VITALS — BP 104/68 | HR 107 | Resp 16 | Ht 70.0 in | Wt 248.8 lb

## 2023-11-29 DIAGNOSIS — E0590 Thyrotoxicosis, unspecified without thyrotoxic crisis or storm: Secondary | ICD-10-CM

## 2023-11-29 DIAGNOSIS — E042 Nontoxic multinodular goiter: Secondary | ICD-10-CM

## 2023-11-29 MED ORDER — LOSARTAN POTASSIUM 50 MG PO TABS
50.0000 mg | ORAL_TABLET | Freq: Every day | ORAL | 1 refills | Status: AC
Start: 2023-11-29 — End: ?

## 2023-11-29 MED ORDER — METHIMAZOLE 5 MG PO TABS: 2.5000 mg | ORAL_TABLET | Freq: Every day | ORAL | 3 refills | Status: AC

## 2023-11-29 NOTE — Progress Notes (Signed)
 Outpatient Endocrinology Note Greg Noon, MD   Patient's Name: Greg Fry    DOB: Feb 03, 1955    MRN: [id number]  REASON OF VISIT: Follow up for hyperthyroidism / Goiter  REFERRING PROVIDER: Oley Bascom RAMAN, NP  PCP: Greg Millman, PA  HISTORY OF PRESENT ILLNESS:   Greg Fry is a 68 y.o. old male with past medical history as listed below is presented for follow up for hyperthyroidism and thyroid  goiter.   Pertinent Thyroid  History: Patient was hospitalized in January 2025 due to stroke, CT angiogram head and neck with IV iodine contrast on January 31, 2023 showed thyroid  goiter with intrathoracic extension, mainly left substernal goiter, CT images reviewed.  He has not had ultrasound thyroid  yet.  Patient has mildly low TSH, with normal free T4 and T3 consistent with subclinical hyperthyroidism.  He was noted to have low TSH from June 2024. In January 2025, when he had subclinical hyperthyroidism, he had CT scan with IV iodine contrast prior to the lab test.  Patient denies palpitation or heat intolerance.  No increased sweating.  Body weight is relatively stable.  He denies neck discomfort, dysphagia, change in voice or difficulty breathing.  He has never been on thyroid  medication.  Family history of thyroid  disorder in sister requiring thyroid  surgery for goiter, no thyroid  cancer.  Mother has goiter and also taking levothyroxine for hypothyroidism.  No radiation exposure to head and neck.  US  thyroid  : March 27, 2023 : Multiple thyroid  nodules mostly small bilateral with dominant left inferior thyroid  nodule measuring 3.4 cm mixed cystic/solid.  No high risk features.  Thyroid  autoantibodies were negative for Hashimoto thyroiditis and Graves' disease.  Interval history : Patient had recent labs reviewed as follows still with low TSH, normal free T4 and free T3.  Patient denies palpitation and heat intolerance.  He denies any neck compressive symptoms, neck discomfort,  dysphagia or difficulty breathing in any position.  Patient is accompanied by sister in the clinic today.  No other complaints today.   Latest Reference Range & Units 11/23/23 13:51  TSH 0.450 - 4.500 uIU/mL 0.137 (L)  Triiodothyronine,Free,Serum 2.0 - 4.4 pg/mL 3.6  T4,Free(Direct) 0.82 - 1.77 ng/dL 8.58  (L): Data is abnormally low  He has tachycardia with heart rate 107 today.  REVIEW OF SYSTEMS:  As per history of present illness.   PAST MEDICAL HISTORY: Past Medical History:  Diagnosis Date   Dislocated shoulder    Hypertension    Stroke [facility])     PAST SURGICAL HISTORY: Past Surgical History:  Procedure Laterality Date   left shoulder dislocation Left    SHOULDER CLOSED REDUCTION Left 06/28/2013   Procedure: CLOSED REDUCTION LEFT SHOULDER UNDER ANESTHESIA;  Surgeon: Taft FORBES Minerva, MD;  Location: [facility];  Service: Orthopedics;  Laterality: Left;    ALLERGIES: Allergies  Allergen Reactions   Penicillins Other (See Comments)    Child hood reaction    FAMILY HISTORY:  History reviewed. No pertinent family history.  SOCIAL HISTORY: Social History   Socioeconomic History   Marital status: Divorced    Spouse name: Not on file   Number of children: Not on file   Years of education: Not on file   Highest education level: Not on file  Occupational History   Not on file  Tobacco Use   Smoking status: Former    Types: Cigarettes   Smokeless tobacco: Not on file   Tobacco comments:    Stopped smoking 5 years ago.  Substance and Sexual Activity   Alcohol use: Yes    Comment: occasion   Drug use: Not Currently    Types: Cocaine, Marijuana    Comment: pt denies any use recently,    Sexual activity: Never  Other Topics Concern   Not on file  Social History Narrative   Not on file   Social Drivers of Health   Financial Resource Strain: Not on file  Food Insecurity: No Food Insecurity (02/16/2023)   Hunger Vital Sign    Worried About Running Out of Food  in the Last Year: Never true    Ran Out of Food in the Last Year: Never true  Transportation Needs: No Transportation Needs (02/16/2023)   PRAPARE - Administrator, Civil Service (Medical): No    Lack of Transportation (Non-Medical): No  Physical Activity: Not on file  Stress: Not on file  Social Connections: Moderately Integrated (01/29/2023)   Social Connection and Isolation Panel    Frequency of Communication with Friends and Family: More than three times a week    Frequency of Social Gatherings with Friends and Family: Twice a week    Attends Religious Services: More than 4 times per year    Active Member of [employer] or Organizations: No    Attends Banker Meetings: Never    Marital Status: Living with partner    MEDICATIONS:  Current Outpatient Medications  Medication Sig Dispense Refill   aspirin  EC 81 MG tablet Take 1 tablet (81 mg total) by mouth every 6 (six) hours as needed (high blood pressure). Swallow whole. 90 tablet 2   losartan  (COZAAR ) 50 MG tablet Take 1 tablet (50 mg total) by mouth daily. 30 tablet 1   methimazole (TAPAZOLE) 5 MG tablet Take 0.5 tablets (2.5 mg total) by mouth daily. 45 tablet 3   rosuvastatin  (CRESTOR ) 10 MG tablet Take 1 tablet (10 mg total) by mouth daily. 30 tablet 11   No current facility-administered medications for this visit.    PHYSICAL EXAM: Vitals:   11/29/23 1344  BP: 104/68  Pulse: (!) 107  Resp: 16  SpO2: 96%  Weight: 248 lb 12.8 oz (112.9 kg)  Height: 5' 10 (1.778 m)    Body mass index is 35.7 kg/m.  Wt Readings from Last 3 Encounters:  11/29/23 248 lb 12.8 oz (112.9 kg)  10/11/23 246 lb 12.8 oz (111.9 kg)  08/30/23 245 lb 9.6 oz (111.4 kg)    General: Well developed, well nourished male in no apparent distress.  HEENT: AT/[place], no external lesions. Hearing intact to the spoken word Eyes: EOMI. No stare, proptosis or lid lag. Conjunctiva clear and no icterus. No erythema or watering Neck: Trachea  midline, neck supple without appreciable thyromegaly or lymphadenopathy and left palpable thyroid  nodule + Lungs: Clear to auscultation, no wheeze. Respirations not labored Heart: S1S2, Regular in rate and rhythm. No loud murmurs Abdomen: Soft, non tender, non distended Neurologic: Alert, oriented, normal speech, deep tendon biceps reflexes normal,  no gross focal neurological deficit Extremities: No pedal pitting edema, no tremors of outstretched hands Skin: Warm, color good.  Psychiatric: Does not appear depressed or anxious  PERTINENT HISTORIC LABORATORY AND IMAGING STUDIES:  All pertinent laboratory results were reviewed. Please see HPI also for further details.   TSH  Date Value Ref Range Status  11/23/2023 0.137 (L) 0.450 - 4.500 uIU/mL Final  08/24/2023 0.07 (L) 0.40 - 4.50 mIU/L Final  05/15/2023 0.03 (L) 0.40 - 4.50 mIU/L Final  Lab Results  Component Value Date   FREET4 1.41 11/23/2023   FREET4 1.4 08/24/2023   FREET4 1.3 05/15/2023   T3FREE 3.6 11/23/2023   T3FREE 4.3 (H) 08/24/2023   T3FREE 4.2 05/15/2023   TSH 0.137 (L) 11/23/2023   TSH 0.07 (L) 08/24/2023   TSH 0.03 (L) 05/15/2023    No results found for: THYROTRECAB  Lab Results  Component Value Date   TSH 0.137 (L) 11/23/2023   TSH 0.07 (L) 08/24/2023   TSH 0.03 (L) 05/15/2023   FREET4 1.41 11/23/2023   FREET4 1.4 08/24/2023   FREET4 1.3 05/15/2023     Lab Results  Component Value Date   TSI <89 03/23/2023     No components found for: TRAB     Latest Reference Range & Units 03/23/23 14:21  Thyroglobulin Ab < or = 1 IU/mL <1  Thyroperoxidase Ab SerPl-aCnc <9 IU/mL <1  THYROID  STIMULATING IMMUNOGLOBULIN  Rpt  TSI <140 % baseline <89  TRAB <=2.00 IU/L <1.00  Rpt: View report in Results Review for more information    Latest Reference Range & Units 07/07/22 15:43 09/29/22 14:19 12/22/22 14:03 01/30/23 04:27 02/08/23 14:23  TSH 0.450 - 4.500 uIU/mL 0.375 (L) 0.301 (L) 0.339 (L) 0.475 0.268  (L)  T4,Free(Direct) 0.61 - 1.12 ng/dL    8.83 (H)   Thyroxine (T4) 4.5 - 12.0 ug/dL  8.9 9.4  9.7  Free Thyroxine Index 1.2 - 4.9   2.6 2.8  3.0  T3 Uptake Ratio 24 - 39 %  29 30  31   (L): Data is abnormally low (H): Data is abnormally high  ASSESSMENT / PLAN  1. Hyperthyroidism   2. Multinodular goiter   3. Subclinical hyperthyroidism    -Patient has incidental finding of thyroid  goiter with left substernal goiter/intrathoracic extension on CTA neck in January 2025.  Patient denies neck compressive symptoms.  Patient has lab results consistent with subclinical hyperthyroidism with low TSH at least from June 2024.  Patient had negative antibodies for Hashimoto thyroiditis and Graves' disease.  Ultrasound thyroid  in March 2025 showed bilateral multiple thyroid  nodules including dominant left inferior thyroid  nodule measuring 3.4 cm, is complex nodule.  Patient is clinically euthyroid, no hyperthyroid symptoms.  There is a possibility of hyperfunctioning thyroid  nodule to cause hyperthyroidism. - He has intermittently low TSH.   Plan: -He continues to have low TSH consistent with mild hyperthyroidism. -Start low-dose of methimazole 2.5 mg daily.  Discussed potential side effects including agranulocytosis/liver toxicity and allergic reaction.  Discussed that it is rare to have side effects on low-dose of methimazole. -Discussed and recommended about radioactive iodine nuclear uptake and scan to evaluate for hyperthyroidism possibly caused hyperfunctioning nodule.  Patient may consider early next year as he will be having new medical insurance.   -Will monitor thyroid  nodules with serial ultrasound, no plan for biopsy at this time. - Patient wants to have lab at [facility] prior to follow-up visit, prescription provided.  Diagnoses and all orders for this visit:  Hyperthyroidism -     methimazole (TAPAZOLE) 5 MG tablet; Take 0.5 tablets (2.5 mg total) by mouth daily. -     T4, free -      TSH -     T3, free  Multinodular goiter  Subclinical hyperthyroidism    DISPOSITION Follow up in clinic in 3 months suggested.  Labs prior to follow-up visit.  All questions answered and patient verbalized understanding of the plan.  Wilburt Messina, MD [facility]  [address] [place], [place] [address] Phone # [phone]   At least part of this note was generated using voice recognition software. Inadvertent word errors may have occurred, which were not recognized during the proofreading process.

## 2023-11-29 NOTE — Telephone Encounter (Signed)
*  STAT* If patient is at the pharmacy, call can be transferred to refill team.   1. Which medications need to be refilled? (please list name of each medication and dose if known)   losartan  (COZAAR ) 50 MG tablet     2. Would you like to learn more about the convenience, safety, & potential cost savings by using the [facility]? No   3. Are you open to using the [facility] (Type [facility]. ) No   4. Which pharmacy/location (including street and city if local pharmacy) is medication to be sent to?  [facility] [place], [place] - [id number]    5. Do they need a 30 day or 90 day supply? 90 day

## 2023-11-30 ENCOUNTER — Telehealth: Payer: Self-pay | Admitting: Neurology

## 2023-11-30 NOTE — Telephone Encounter (Signed)
 Pt sister made request to cx, will call back at the 1st of the new year to r/s (insurance related)

## 2023-12-25 ENCOUNTER — Ambulatory Visit: Admitting: Neurology

## 2024-02-23 ENCOUNTER — Other Ambulatory Visit: Payer: Self-pay | Admitting: Vascular Surgery

## 2024-02-23 DIAGNOSIS — I6529 Occlusion and stenosis of unspecified carotid artery: Secondary | ICD-10-CM

## 2024-02-28 ENCOUNTER — Ambulatory Visit: Payer: Self-pay | Admitting: Physician Assistant

## 2024-02-28 ENCOUNTER — Ambulatory Visit: Admitting: Endocrinology

## 2024-03-18 ENCOUNTER — Ambulatory Visit: Admitting: Student

## 2024-03-20 ENCOUNTER — Ambulatory Visit (HOSPITAL_COMMUNITY)

## 2024-03-20 ENCOUNTER — Ambulatory Visit
